# Patient Record
Sex: Female | Born: 1985 | Race: White | Hispanic: No | Marital: Married | Smoking: Former smoker
Health system: Southern US, Community
[De-identification: ages and names within clinical notes are randomized; demographics above are authoritative.]

## PROBLEM LIST (undated history)

## (undated) DIAGNOSIS — N979 Female infertility, unspecified: Secondary | ICD-10-CM

## (undated) DIAGNOSIS — E119 Type 2 diabetes mellitus without complications: Secondary | ICD-10-CM

## (undated) DIAGNOSIS — D649 Anemia, unspecified: Secondary | ICD-10-CM

## (undated) DIAGNOSIS — R002 Palpitations: Secondary | ICD-10-CM

## (undated) DIAGNOSIS — E282 Polycystic ovarian syndrome: Secondary | ICD-10-CM

## (undated) DIAGNOSIS — K429 Umbilical hernia without obstruction or gangrene: Secondary | ICD-10-CM

## (undated) DIAGNOSIS — K589 Irritable bowel syndrome without diarrhea: Secondary | ICD-10-CM

## (undated) HISTORY — DX: Umbilical hernia without obstruction or gangrene: K42.9

## (undated) HISTORY — DX: Type 2 diabetes mellitus without complications: E11.9

## (undated) HISTORY — DX: Female infertility, unspecified: N97.9

## (undated) HISTORY — DX: Irritable bowel syndrome, unspecified: K58.9

## (undated) HISTORY — PX: TONSILLECTOMY: SUR1361

## (undated) HISTORY — DX: Irritable bowel syndrome without diarrhea: K58.9

## (undated) HISTORY — DX: Anemia, unspecified: D64.9

---

## 2012-11-29 ENCOUNTER — Emergency Department (HOSPITAL_COMMUNITY)
Admission: EM | Admit: 2012-11-29 | Discharge: 2012-11-29 | Disposition: A | Discharge status: Home / Self Care | Payer: 59 | Attending: Emergency Medicine | Admitting: Emergency Medicine

## 2012-11-29 ENCOUNTER — Emergency Department (HOSPITAL_COMMUNITY): Payer: 59

## 2012-11-29 ENCOUNTER — Encounter (HOSPITAL_COMMUNITY): Payer: Self-pay | Admitting: Nurse Practitioner

## 2012-11-29 DIAGNOSIS — R630 Anorexia: Secondary | ICD-10-CM | POA: Insufficient documentation

## 2012-11-29 DIAGNOSIS — E282 Polycystic ovarian syndrome: Secondary | ICD-10-CM | POA: Insufficient documentation

## 2012-11-29 DIAGNOSIS — Z791 Long term (current) use of non-steroidal anti-inflammatories (NSAID): Secondary | ICD-10-CM | POA: Insufficient documentation

## 2012-11-29 DIAGNOSIS — R110 Nausea: Secondary | ICD-10-CM | POA: Insufficient documentation

## 2012-11-29 DIAGNOSIS — K8020 Calculus of gallbladder without cholecystitis without obstruction: Secondary | ICD-10-CM | POA: Insufficient documentation

## 2012-11-29 DIAGNOSIS — F17200 Nicotine dependence, unspecified, uncomplicated: Secondary | ICD-10-CM | POA: Insufficient documentation

## 2012-11-29 DIAGNOSIS — E119 Type 2 diabetes mellitus without complications: Secondary | ICD-10-CM | POA: Insufficient documentation

## 2012-11-29 DIAGNOSIS — Z79899 Other long term (current) drug therapy: Secondary | ICD-10-CM | POA: Insufficient documentation

## 2012-11-29 DIAGNOSIS — Z3202 Encounter for pregnancy test, result negative: Secondary | ICD-10-CM | POA: Insufficient documentation

## 2012-11-29 HISTORY — DX: Polycystic ovarian syndrome: E28.2

## 2012-11-29 LAB — COMPREHENSIVE METABOLIC PANEL
AST: 15 U/L (ref 0–37)
Albumin: 3.8 g/dL (ref 3.5–5.2)
BUN: 11 mg/dL (ref 6–23)
Calcium: 9.8 mg/dL (ref 8.4–10.5)
Creatinine, Ser: 0.82 mg/dL (ref 0.50–1.10)
Total Protein: 7.9 g/dL (ref 6.0–8.3)

## 2012-11-29 LAB — CBC WITH DIFFERENTIAL/PLATELET
Eosinophils Absolute: 0.1 10*3/uL (ref 0.0–0.7)
Eosinophils Relative: 1 % (ref 0–5)
Hemoglobin: 13.3 g/dL (ref 12.0–15.0)
Lymphocytes Relative: 35 % (ref 12–46)
Lymphs Abs: 3.4 10*3/uL (ref 0.7–4.0)
MCH: 29.4 pg (ref 26.0–34.0)
MCV: 88.3 fL (ref 78.0–100.0)
Monocytes Relative: 4 % (ref 3–12)
Platelets: 259 10*3/uL (ref 150–400)
RBC: 4.53 MIL/uL (ref 3.87–5.11)
WBC: 9.7 10*3/uL (ref 4.0–10.5)

## 2012-11-29 LAB — URINALYSIS, MICROSCOPIC ONLY
Glucose, UA: 100 mg/dL — AB
Hgb urine dipstick: NEGATIVE
Ketones, ur: NEGATIVE mg/dL
Leukocytes, UA: NEGATIVE
Protein, ur: NEGATIVE mg/dL
pH: 6 (ref 5.0–8.0)

## 2012-11-29 LAB — LIPASE, BLOOD: Lipase: 39 U/L (ref 11–59)

## 2012-11-29 MED ORDER — HYDROCODONE-ACETAMINOPHEN 5-325 MG PO TABS: 1.0000 | ORAL_TABLET | Freq: Four times a day (QID) | ORAL | Status: DC | PRN

## 2012-11-29 NOTE — ED Provider Notes (Addendum)
History     CSN: [id number]  Arrival date & time 11/29/12  1202   First MD Initiated Contact with Patient 11/29/12 1244      Chief Complaint  Patient presents with  . Abdominal Pain    (Consider location/radiation/quality/duration/timing/severity/associated sxs/prior treatment) Patient is a 27 y.o. female presenting with abdominal pain. The history is provided by the patient.  Abdominal Pain The primary symptoms of the illness include abdominal pain and nausea. The primary symptoms of the illness do not include fever, vomiting or diarrhea. The current episode started 3 to 5 hours ago. The onset of the illness was sudden. The problem has been resolved.  The abdominal pain is located in the RUQ. Pain radiation: Upper back. The severity of the abdominal pain is 10/10 (Currently pain is 0). The abdominal pain is relieved by nothing. The abdominal pain is exacerbated by eating.  The patient states that she believes she is currently not pregnant. The patient has not had a change in bowel habit. Additional symptoms associated with the illness include anorexia. Symptoms associated with the illness do not include urgency, frequency or back pain. Significant associated medical issues include diabetes.    Past Medical History  Diagnosis Date  . Polycystic ovarian disease     Past Surgical History  Procedure Date  . Tonsillectomy     No family history on file.  History  Substance Use Topics  . Smoking status: Current Every Day Smoker  . Smokeless tobacco: Not on file  . Alcohol Use: No    OB History    Grav Para Term Preterm Abortions TAB SAB Ect Mult Living                  Review of Systems  Constitutional: Negative for fever.  Gastrointestinal: Positive for nausea, abdominal pain and anorexia. Negative for vomiting and diarrhea.  Genitourinary: Negative for urgency and frequency.  Musculoskeletal: Negative for back pain.  All other systems reviewed and are  negative.    Allergies  Review of patient's allergies indicates no known allergies.  Home Medications   Current Outpatient Rx  Name  Route  Sig  Dispense  Refill  . CALCIUM CARBONATE ANTACID 500 MG PO CHEW   Oral   Chew 1 tablet by mouth as needed. For heartburn         . FLUTICASONE PROPIONATE 50 MCG/ACT NA SUSP   Nasal   Place 1 spray into the nose daily as needed. For allergies         . IBUPROFEN 200 MG PO TABS   Oral   Take 400 mg by mouth every 6 (six) hours as needed. For pain         . METFORMIN HCL 500 MG PO TABS   Oral   Take 500 mg by mouth 3 (three) times daily.         Marland Kitchen OMEPRAZOLE 40 MG PO CPDR   Oral   Take 40 mg by mouth daily.           BP 157/86  Pulse 98  Temp 97.7 F (36.5 C) (Oral)  Resp 16  SpO2 99%  LMP 11/20/2012  Physical Exam  Nursing note and vitals reviewed. Constitutional: She is oriented to person, place, and time. She appears well-developed and well-nourished. No distress.  HENT:  Head: Normocephalic and atraumatic.  Mouth/Throat: Oropharynx is clear and moist.  Eyes: Conjunctivae normal and EOM are normal. Pupils are equal, round, and reactive to light.  Neck:  Normal range of motion. Neck supple.  Cardiovascular: Normal rate, regular rhythm and intact distal pulses.   No murmur heard. Pulmonary/Chest: Effort normal and breath sounds normal. No respiratory distress. She has no wheezes. She has no rales.  Abdominal: Soft. She exhibits no distension. There is no tenderness. There is no rebound and no guarding.  Musculoskeletal: Normal range of motion. She exhibits no edema and no tenderness.  Neurological: She is alert and oriented to person, place, and time.  Skin: Skin is warm and dry. No rash noted. No erythema.  Psychiatric: She has a normal mood and affect. Her behavior is normal.    ED Course  Procedures (including critical care time)  Labs Reviewed  COMPREHENSIVE METABOLIC PANEL - Abnormal; Notable for the  following:    Sodium 134 (*)     Glucose, Bld 234 (*)     All other components within normal limits  URINALYSIS, MICROSCOPIC ONLY - Abnormal; Notable for the following:    Glucose, UA 100 (*)     All other components within normal limits  LIPASE, BLOOD  CBC WITH DIFFERENTIAL  POCT PREGNANCY, URINE   US Abdomen Complete  11/29/2012  *RADIOLOGY REPORT*  Clinical Data:  Right upper quadrant pain.  Evaluate gallbladder.  COMPLETE ABDOMINAL ULTRASOUND  Comparison:  None.  Findings:  Gallbladder:  There are echogenic stones within the gallbladder. The stones demonstrate posterior acoustic shadowing.  The patient does not have a sonographic Murphy's sign.  No evidence for gallbladder wall thickening. Largest gallstone roughly measures 1.6 cm.  Common bile duct:  Common bile duct measures up to 0.5 cm.  Liver:  No focal lesion identified.  Within normal limits in parenchymal echogenicity.  IVC:  Appears normal.  Pancreas:  Not visualized.  Spleen:  Spleen measures 7.0 cm in length without focal abnormality.  Right Kidney:  Right kidney measures 12.2 cm in length without hydronephrosis.  Left Kidney:  Left kidney measures 12.4 cm in length without hydronephrosis.  Abdominal aorta:  Poorly visualized.  IMPRESSION: Cholelithiasis.  No evidence for biliary dilatation.   Original Report Authenticated By: Richarda Overlie, M.D.      1. Cholelithiasis       MDM   Patient with symptoms most consistent with cholelithiasis. She's had 3 attacks now in the last month 2 in the last few weeks. Woke up today with severe right upper quadrant pain that radiated into her back. 5 minutes prior to being seen the pain resolved. Currently she is pain-free but states when the pain occurs she gets sweaty and nauseated. Abdomen is now benign. Will do an ultrasound to evaluate for gallstones and followup with Gen. Surgery. CBC, CMP, UA are within normal limits other than hyperglycemia and patient is a known diabetic.   3:05  PM Normal labs here. Ultrasound shows cholelithiasis and on repeat evaluation still no abdominal pain. She was discharged home with followup general surgery     Gwyneth Sprout, MD 11/29/12 1504  Gwyneth Sprout, MD 11/29/12 1506  Gwyneth Sprout, MD 11/29/12 [phone]

## 2012-11-29 NOTE — ED Notes (Signed)
Pt reports RLQ abd pain, "feels like its swollen and my back hurts" x 1 week, intermittent. Also nausea. Denies bowel/bladder changes

## 2012-11-29 NOTE — ED Notes (Signed)
Patient transported to Ultrasound 

## 2013-03-20 ENCOUNTER — Inpatient Hospital Stay (HOSPITAL_COMMUNITY)
Admission: EM | Admit: 2013-03-20 | Discharge: 2013-03-22 | DRG: 419 | Disposition: A | Discharge status: Home / Self Care | Payer: 59 | Attending: General Surgery | Admitting: General Surgery

## 2013-03-20 ENCOUNTER — Encounter (HOSPITAL_COMMUNITY): Payer: Self-pay | Admitting: Adult Health

## 2013-03-20 DIAGNOSIS — E119 Type 2 diabetes mellitus without complications: Secondary | ICD-10-CM

## 2013-03-20 DIAGNOSIS — K8010 Calculus of gallbladder with chronic cholecystitis without obstruction: Secondary | ICD-10-CM

## 2013-03-20 DIAGNOSIS — F17200 Nicotine dependence, unspecified, uncomplicated: Secondary | ICD-10-CM | POA: Diagnosis present

## 2013-03-20 DIAGNOSIS — K8000 Calculus of gallbladder with acute cholecystitis without obstruction: Principal | ICD-10-CM | POA: Diagnosis present

## 2013-03-20 DIAGNOSIS — K8020 Calculus of gallbladder without cholecystitis without obstruction: Secondary | ICD-10-CM

## 2013-03-20 DIAGNOSIS — E282 Polycystic ovarian syndrome: Secondary | ICD-10-CM | POA: Diagnosis present

## 2013-03-20 LAB — COMPREHENSIVE METABOLIC PANEL
ALT: 15 U/L (ref 0–35)
Alkaline Phosphatase: 84 U/L (ref 39–117)
BUN: 13 mg/dL (ref 6–23)
CO2: 23 mEq/L (ref 19–32)
Chloride: 101 mEq/L (ref 96–112)
GFR calc Af Amer: >90 mL/min (ref >90)
GFR calc non Af Amer: >90 mL/min (ref >90)
Glucose, Bld: 157 mg/dL — ABNORMAL HIGH (ref 70–99)
Potassium: 3.8 mEq/L (ref 3.5–5.1)
Sodium: 138 mEq/L (ref 135–145)
Total Bilirubin: 0.4 mg/dL (ref 0.3–1.2)
Total Protein: 7.9 g/dL (ref 6.0–8.3)

## 2013-03-20 LAB — CBC WITH DIFFERENTIAL/PLATELET
Hemoglobin: 14.3 g/dL (ref 12.0–15.0)
Lymphocytes Relative: 37 % (ref 12–46)
Lymphs Abs: 5.5 10*3/uL — ABNORMAL HIGH (ref 0.7–4.0)
Monocytes Relative: 6 % (ref 3–12)
Neutro Abs: 8.3 10*3/uL — ABNORMAL HIGH (ref 1.7–7.7)
Neutrophils Relative %: 56 % (ref 43–77)
Platelets: 260 10*3/uL (ref 150–400)
RBC: 4.61 MIL/uL (ref 3.87–5.11)
WBC: 14.9 10*3/uL — ABNORMAL HIGH (ref 4.0–10.5)

## 2013-03-20 MED ORDER — MORPHINE SULFATE 4 MG/ML IJ SOLN
4.0000 mg | Freq: Once | INTRAMUSCULAR | Status: AC
  Administered 2013-03-20: 4 mg via INTRAVENOUS
  Filled 2013-03-20: qty 1

## 2013-03-20 MED ORDER — ONDANSETRON HCL 4 MG/2ML IJ SOLN
4.0000 mg | Freq: Once | INTRAMUSCULAR | Status: AC
  Administered 2013-03-20: 4 mg via INTRAVENOUS
  Filled 2013-03-20: qty 2

## 2013-03-20 MED ORDER — GI COCKTAIL ~~LOC~~
30.0000 mL | Freq: Once | ORAL | Status: AC
  Administered 2013-03-20: 30 mL via ORAL
  Filled 2013-03-20: qty 30

## 2013-03-20 MED ORDER — FENTANYL CITRATE 0.05 MG/ML IJ SOLN
INTRAMUSCULAR | Status: AC
  Filled 2013-03-20: qty 2

## 2013-03-20 MED ORDER — ONDANSETRON 4 MG PO TBDP
8.0000 mg | ORAL_TABLET | Freq: Once | ORAL | Status: AC
  Administered 2013-03-20: 8 mg via ORAL

## 2013-03-20 MED ORDER — FENTANYL CITRATE 0.05 MG/ML IJ SOLN
50.0000 ug | Freq: Once | INTRAMUSCULAR | Status: AC
  Administered 2013-03-20: 50 ug via NASAL

## 2013-03-20 MED ORDER — ONDANSETRON 4 MG PO TBDP
ORAL_TABLET | ORAL | Status: AC
  Filled 2013-03-20: qty 2

## 2013-03-20 MED ORDER — SODIUM CHLORIDE 0.9 % IV BOLUS (SEPSIS)
1000.0000 mL | Freq: Once | INTRAVENOUS | Status: AC
  Administered 2013-03-20: 1000 mL via INTRAVENOUS

## 2013-03-20 NOTE — ED Notes (Signed)
Presents with recent DX of gallstones, today pain became unbearable. Reports right sided upper abdominal pain radiateion to chest and right shoulder and nausea. She was given a referral to a surgeon but is unable to afford at time. Pt is unable to sit still and rates pain 10/10

## 2013-03-20 NOTE — ED Provider Notes (Signed)
History     CSN: [id number]  Arrival date & time 03/20/13  2228   First MD Initiated Contact with Patient 03/20/13 2321      Chief Complaint  Patient presents with  . Abdominal Pain   HPI  History provided by the patient. Patient is a 27 year old female with history of polycystic ovarian disease presenting with complaints of worsening and persistent right upper quadrant abdominal pain. Patient states pains are caused from her gallstones. She was diagnosed as having gallstones in January and states that she is unable to followup with the surgeon because she cannot afford the co-pay at this time. Patient states she generally was doing well since January without any significant pains or flareups. She does report having some increased pain last Sunday but she was able to treat this at home with hot shower and Vicodin. She did much better the following day and did not have any pains until around 6 PM this evening. Pain is similar located in the right upper quadrant and rib area radiating some to the back. It was associated with nausea and one episode of vomiting. Patient attempted to take Vicodin without any improvements. She also use heating pad over the area without any changes in symptoms. He denies feeling any fever but reports occasional chills and sweats. Denies any recent urinary symptoms. No other aggravating or alleviating factors. No other associated symptoms.     Past Medical History  Diagnosis Date  . Polycystic ovarian disease     Past Surgical History  Procedure Laterality Date  . Tonsillectomy      History reviewed. No pertinent family history.  History  Substance Use Topics  . Smoking status: Current Every Day Smoker  . Smokeless tobacco: Not on file  . Alcohol Use: No    OB History   Grav Para Term Preterm Abortions TAB SAB Ect Mult Living                  Review of Systems  Constitutional: Positive for chills and diaphoresis. Negative for fever.   Respiratory: Negative for shortness of breath.   Cardiovascular: Negative for chest pain.  Gastrointestinal: Positive for nausea, vomiting and abdominal pain. Negative for diarrhea and constipation.  Genitourinary: Negative for dysuria, frequency, hematuria, flank pain, vaginal bleeding and vaginal discharge.  All other systems reviewed and are negative.    Allergies  Review of patient's allergies indicates no known allergies.  Home Medications   Current Outpatient Rx  Name  Route  Sig  Dispense  Refill  . calcium carbonate (TUMS - DOSED IN MG ELEMENTAL CALCIUM) 500 MG chewable tablet   Oral   Chew 1 tablet by mouth as needed. For heartburn         . fluticasone (FLONASE) 50 MCG/ACT nasal spray   Nasal   Place 1 spray into the nose daily as needed. For allergies         . HYDROcodone-acetaminophen (NORCO/VICODIN) 5-325 MG per tablet   Oral   Take 1 tablet by mouth every 6 (six) hours as needed for pain.   10 tablet   0   . ibuprofen (ADVIL,MOTRIN) 200 MG tablet   Oral   Take 400 mg by mouth every 6 (six) hours as needed. For pain         . metFORMIN (GLUCOPHAGE) 500 MG tablet   Oral   Take 500 mg by mouth 3 (three) times daily.         Marland Kitchen omeprazole (PRILOSEC) 40  MG capsule   Oral   Take 40 mg by mouth daily.           BP 130/66  Pulse 73  Temp(Src) 98.6 F (37 C)  Resp 16  SpO2 96%  Physical Exam  Nursing note and vitals reviewed. Constitutional: She is oriented to person, place, and time. She appears well-developed and well-nourished. No distress.  HENT:  Head: Normocephalic and atraumatic.  Cardiovascular: Normal rate and regular rhythm.   Pulmonary/Chest: Effort normal and breath sounds normal. No respiratory distress. She has no wheezes. She has no rales.  Abdominal: Soft. There is tenderness in the right upper quadrant. There is positive Murphy's sign. There is no rebound, no guarding, no CVA tenderness and no tenderness at McBurney's point.   Musculoskeletal: Normal range of motion.  Neurological: She is alert and oriented to person, place, and time.  Skin: Skin is warm and dry. No rash noted.  Psychiatric: She has a normal mood and affect. Her behavior is normal.    ED Course  Procedures   Results for orders placed during the hospital encounter of 03/20/13  CBC WITH DIFFERENTIAL      Result Value Range   WBC 14.9 (*) 4.0 - 10.5 K/uL   RBC 4.61  3.87 - 5.11 MIL/uL   Hemoglobin 14.3  12.0 - 15.0 g/dL   HCT 08.6  57.8 - 46.9 %   MCV 87.2  78.0 - 100.0 fL   MCH 31.0  26.0 - 34.0 pg   MCHC 35.6  30.0 - 36.0 g/dL   RDW 62.9  52.8 - 41.3 %   Platelets 260  150 - 400 K/uL   Neutrophils Relative 56  43 - 77 %   Neutro Abs 8.3 (*) 1.7 - 7.7 K/uL   Lymphocytes Relative 37  12 - 46 %   Lymphs Abs 5.5 (*) 0.7 - 4.0 K/uL   Monocytes Relative 6  3 - 12 %   Monocytes Absolute 0.9  0.1 - 1.0 K/uL   Eosinophils Relative 1  0 - 5 %   Eosinophils Absolute 0.1  0.0 - 0.7 K/uL   Basophils Relative 0  0 - 1 %   Basophils Absolute 0.1  0.0 - 0.1 K/uL  COMPREHENSIVE METABOLIC PANEL      Result Value Range   Sodium 138  135 - 145 mEq/L   Potassium 3.8  3.5 - 5.1 mEq/L   Chloride 101  96 - 112 mEq/L   CO2 23  19 - 32 mEq/L   Glucose, Bld 157 (*) 70 - 99 mg/dL   BUN 13  6 - 23 mg/dL   Creatinine, Ser 2.44  0.50 - 1.10 mg/dL   Calcium 01.0 (*) 8.4 - 10.5 mg/dL   Total Protein 7.9  6.0 - 8.3 g/dL   Albumin 4.0  3.5 - 5.2 g/dL   AST 15  0 - 37 U/L   ALT 15  0 - 35 U/L   Alkaline Phosphatase 84  39 - 117 U/L   Total Bilirubin 0.4  0.3 - 1.2 mg/dL   GFR calc non Af Amer >90  >90 mL/min   GFR calc Af Amer >90  >90 mL/min  LIPASE, BLOOD      Result Value Range   Lipase 37  11 - 59 U/L  POCT PREGNANCY, URINE      Result Value Range   Preg Test, Ur NEGATIVE  NEGATIVE         US Abdomen Complete  03/21/2013  *RADIOLOGY REPORT*  Clinical Data:  Abdominal pain.  COMPLETE ABDOMINAL ULTRASOUND  Comparison:  11/29/2012  Findings:   Gallbladder:  Stones are demonstrated in the dependent portion of the gallbladder and the gallbladder neck. Gallbladder neck stone does not move during the examination. Appearance is similar to previous study.  No gallbladder wall thickening or pericholecystic edema.  Murphy's sign is positive.  Changes are nonspecific but could be due to cholecystitis in the appropriate clinical setting.  Common bile duct:  Normal caliber with measured diameter of 4.3 mm.  Liver:  No focal lesion identified.  Within normal limits in parenchymal echogenicity.  IVC:  Appears normal.  Pancreas:  Pancreas is not well visualized due to overlying bowel gas.  Spleen:  Spleen length measures 12 cm.  Normal parenchymal echotexture.  Right Kidney:  Right kidney measures 12 cm length.  No hydronephrosis.  Left Kidney:  Left kidney measures 12.8 cm length.  No hydronephrosis.  Abdominal aorta:  No aneurysm identified.  IMPRESSION: Cholelithiasis and positive Murphy's sign are nonspecific but may be associated with acute cholecystitis in the appropriate clinical setting.  No bile duct dilatation.   Original Report Authenticated By: Burman Nieves, M.D.      1. Cholelithiasis       MDM  11:20PM patient seen and evaluated. Patient appears in moderate discomfort but no acute distress. History of gallstones evaluated on January 4.   Reason discussed with attending physician. Will consult Gen. surgery given location of stone and elevated WBC.   Spoke with Dr. Janee Morn which surgery. See patient and evaluate.   Dr. Janee Morn has seen patient and will admit.  Angus Seller, PA-C 03/21/13 [phone]

## 2013-03-21 ENCOUNTER — Inpatient Hospital Stay (HOSPITAL_COMMUNITY): Payer: 59

## 2013-03-21 ENCOUNTER — Observation Stay (HOSPITAL_COMMUNITY): Payer: 59 | Admitting: Anesthesiology

## 2013-03-21 ENCOUNTER — Encounter (HOSPITAL_COMMUNITY): Payer: Self-pay | Admitting: *Deleted

## 2013-03-21 ENCOUNTER — Encounter (HOSPITAL_COMMUNITY): Payer: Self-pay | Admitting: Anesthesiology

## 2013-03-21 ENCOUNTER — Emergency Department (HOSPITAL_COMMUNITY): Payer: 59

## 2013-03-21 ENCOUNTER — Encounter (HOSPITAL_COMMUNITY): Admission: EM | Disposition: A | Discharge status: Home / Self Care | Payer: Self-pay | Source: Home / Self Care

## 2013-03-21 ENCOUNTER — Ambulatory Visit: Admit: 2013-03-21 | Payer: Self-pay | Admitting: General Surgery

## 2013-03-21 DIAGNOSIS — E119 Type 2 diabetes mellitus without complications: Secondary | ICD-10-CM

## 2013-03-21 DIAGNOSIS — K8010 Calculus of gallbladder with chronic cholecystitis without obstruction: Secondary | ICD-10-CM

## 2013-03-21 DIAGNOSIS — E282 Polycystic ovarian syndrome: Secondary | ICD-10-CM

## 2013-03-21 HISTORY — PX: CHOLECYSTECTOMY: SHX55

## 2013-03-21 LAB — GLUCOSE, CAPILLARY
Glucose-Capillary: 99 mg/dL (ref 70–99)
Glucose-Capillary: 171 mg/dL — ABNORMAL HIGH (ref 70–99)

## 2013-03-21 SURGERY — LAPAROSCOPIC CHOLECYSTECTOMY WITH INTRAOPERATIVE CHOLANGIOGRAM
Anesthesia: General | Wound class: Clean Contaminated

## 2013-03-21 MED ORDER — CIPROFLOXACIN IN D5W 400 MG/200ML IV SOLN
400.0000 mg | Freq: Two times a day (BID) | INTRAVENOUS | Status: DC
  Administered 2013-03-21: 400 mg via INTRAVENOUS
  Filled 2013-03-21 (×2): qty 200

## 2013-03-21 MED ORDER — LACTATED RINGERS IV SOLN
INTRAVENOUS | Status: DC | PRN
  Administered 2013-03-21 (×2): via INTRAVENOUS

## 2013-03-21 MED ORDER — PROPOFOL 10 MG/ML IV BOLUS
INTRAVENOUS | Status: DC | PRN
  Administered 2013-03-21: 170 mg via INTRAVENOUS

## 2013-03-21 MED ORDER — GLYCOPYRROLATE 0.2 MG/ML IJ SOLN
INTRAMUSCULAR | Status: DC | PRN
  Administered 2013-03-21: 0.6 mg via INTRAVENOUS

## 2013-03-21 MED ORDER — ACETAMINOPHEN 10 MG/ML IV SOLN
INTRAVENOUS | Status: DC | PRN
  Administered 2013-03-21: 1000 mg via INTRAVENOUS

## 2013-03-21 MED ORDER — SODIUM CHLORIDE 0.9 % IR SOLN
Status: DC | PRN
  Administered 2013-03-21: 1000 mL

## 2013-03-21 MED ORDER — ONDANSETRON HCL 4 MG/2ML IJ SOLN
4.0000 mg | Freq: Four times a day (QID) | INTRAMUSCULAR | Status: DC | PRN
  Administered 2013-03-21: 4 mg via INTRAVENOUS
  Filled 2013-03-21: qty 2

## 2013-03-21 MED ORDER — FENTANYL CITRATE 0.05 MG/ML IJ SOLN
INTRAMUSCULAR | Status: DC | PRN
  Administered 2013-03-21 (×2): 50 ug via INTRAVENOUS
  Administered 2013-03-21 (×2): 100 ug via INTRAVENOUS

## 2013-03-21 MED ORDER — IOHEXOL 300 MG/ML  SOLN
INTRAMUSCULAR | Status: DC | PRN
  Administered 2013-03-21: 10 mL via INTRAVENOUS

## 2013-03-21 MED ORDER — DIPHENHYDRAMINE HCL 50 MG/ML IJ SOLN: 12.5000 mg | Freq: Four times a day (QID) | INTRAMUSCULAR | Status: DC | PRN

## 2013-03-21 MED ORDER — ONDANSETRON HCL 4 MG/2ML IJ SOLN
INTRAMUSCULAR | Status: DC | PRN
  Administered 2013-03-21: 4 mg via INTRAVENOUS

## 2013-03-21 MED ORDER — POTASSIUM CHLORIDE IN NACL 20-0.45 MEQ/L-% IV SOLN
INTRAVENOUS | Status: DC
  Filled 2013-03-21 (×2): qty 1000

## 2013-03-21 MED ORDER — ONDANSETRON HCL 4 MG/2ML IJ SOLN: 4.0000 mg | Freq: Once | INTRAMUSCULAR | Status: AC | PRN

## 2013-03-21 MED ORDER — DIPHENHYDRAMINE HCL 12.5 MG/5ML PO ELIX: 12.5000 mg | ORAL_SOLUTION | Freq: Four times a day (QID) | ORAL | Status: DC | PRN

## 2013-03-21 MED ORDER — PANTOPRAZOLE SODIUM 40 MG PO TBEC
40.0000 mg | DELAYED_RELEASE_TABLET | Freq: Every day | ORAL | Status: DC
Start: 2013-03-21 — End: 2013-03-22
  Administered 2013-03-21 – 2013-03-22 (×2): 40 mg via ORAL
  Filled 2013-03-21 (×2): qty 1

## 2013-03-21 MED ORDER — LIDOCAINE HCL (CARDIAC) 20 MG/ML IV SOLN
INTRAVENOUS | Status: DC | PRN
  Administered 2013-03-21: 75 mg via INTRAVENOUS

## 2013-03-21 MED ORDER — PANTOPRAZOLE SODIUM 40 MG IV SOLR
40.0000 mg | Freq: Every day | INTRAVENOUS | Status: DC
  Filled 2013-03-21: qty 40

## 2013-03-21 MED ORDER — MORPHINE SULFATE 4 MG/ML IJ SOLN
4.0000 mg | Freq: Once | INTRAMUSCULAR | Status: AC
  Administered 2013-03-21: 4 mg via INTRAVENOUS
  Filled 2013-03-21: qty 1

## 2013-03-21 MED ORDER — ROCURONIUM BROMIDE 100 MG/10ML IV SOLN
INTRAVENOUS | Status: DC | PRN
  Administered 2013-03-21: 50 mg via INTRAVENOUS

## 2013-03-21 MED ORDER — ACETAMINOPHEN 10 MG/ML IV SOLN: 1000.0000 mg | Freq: Once | INTRAVENOUS | Status: AC | PRN

## 2013-03-21 MED ORDER — HYDROMORPHONE HCL PF 1 MG/ML IJ SOLN
0.2500 mg | INTRAMUSCULAR | Status: DC | PRN
  Administered 2013-03-21 (×2): 0.5 mg via INTRAVENOUS

## 2013-03-21 MED ORDER — BUPIVACAINE HCL 0.25 % IJ SOLN
INTRAMUSCULAR | Status: DC | PRN
  Administered 2013-03-21: 10 mL

## 2013-03-21 MED ORDER — INSULIN ASPART 100 UNIT/ML ~~LOC~~ SOLN
0.0000 [IU] | SUBCUTANEOUS | Status: DC
  Administered 2013-03-21: 3 [IU] via SUBCUTANEOUS
  Administered 2013-03-21: 2 [IU] via SUBCUTANEOUS
  Administered 2013-03-21 (×2): 3 [IU] via SUBCUTANEOUS
  Administered 2013-03-22 (×2): 2 [IU] via SUBCUTANEOUS

## 2013-03-21 MED ORDER — HEMOSTATIC AGENTS (NO CHARGE) OPTIME
TOPICAL | Status: DC | PRN
  Administered 2013-03-21: 1 via TOPICAL

## 2013-03-21 MED ORDER — OXYCODONE-ACETAMINOPHEN 5-325 MG PO TABS
1.0000 | ORAL_TABLET | ORAL | Status: DC | PRN
  Administered 2013-03-22 (×2): 2 via ORAL
  Filled 2013-03-21 (×2): qty 2

## 2013-03-21 MED ORDER — NEOSTIGMINE METHYLSULFATE 1 MG/ML IJ SOLN
INTRAMUSCULAR | Status: DC | PRN
  Administered 2013-03-21: 3 mg via INTRAVENOUS

## 2013-03-21 MED ORDER — MORPHINE SULFATE 4 MG/ML IJ SOLN
4.0000 mg | INTRAMUSCULAR | Status: DC | PRN
  Administered 2013-03-21 (×3): 4 mg via INTRAVENOUS
  Filled 2013-03-21 (×4): qty 1

## 2013-03-21 MED ORDER — POTASSIUM CHLORIDE IN NACL 20-0.45 MEQ/L-% IV SOLN
INTRAVENOUS | Status: DC
  Administered 2013-03-21: 05:00:00 via INTRAVENOUS
  Filled 2013-03-21 (×3): qty 1000

## 2013-03-21 SURGICAL SUPPLY — 39 items
APPLIER CLIP 5 13 M/L LIGAMAX5 (MISCELLANEOUS) ×2
BENZOIN TINCTURE PRP APPL 2/3 (GAUZE/BANDAGES/DRESSINGS) ×2 IMPLANT
CANISTER SUCTION 2500CC (MISCELLANEOUS) ×2 IMPLANT
CHLORAPREP W/TINT 26ML (MISCELLANEOUS) ×2 IMPLANT
CLIP APPLIE 5 13 M/L LIGAMAX5 (MISCELLANEOUS) ×1 IMPLANT
CLOTH BEACON ORANGE TIMEOUT ST (SAFETY) ×2 IMPLANT
CLSR STERI-STRIP ANTIMIC 1/2X4 (GAUZE/BANDAGES/DRESSINGS) ×2 IMPLANT
COVER MAYO STAND STRL (DRAPES) ×2 IMPLANT
COVER SURGICAL LIGHT HANDLE (MISCELLANEOUS) ×2 IMPLANT
COVER TRANSDUCER ULTRASND (DRAPES) IMPLANT
DEVICE TROCAR PUNCTURE CLOSURE (ENDOMECHANICALS) ×2 IMPLANT
DRAPE C-ARM 42X72 X-RAY (DRAPES) ×2 IMPLANT
DRAPE UTILITY 15X26 W/TAPE STR (DRAPE) ×4 IMPLANT
ELECT REM PT RETURN 9FT ADLT (ELECTROSURGICAL) ×2
ELECTRODE REM PT RTRN 9FT ADLT (ELECTROSURGICAL) ×1 IMPLANT
GAUZE SPONGE 2X2 8PLY STRL LF (GAUZE/BANDAGES/DRESSINGS) ×1 IMPLANT
GLOVE BIO SURGEON STRL SZ7.5 (GLOVE) ×2 IMPLANT
GOWN STRL NON-REIN LRG LVL3 (GOWN DISPOSABLE) ×6 IMPLANT
GOWN STRL REIN XL XLG (GOWN DISPOSABLE) ×2 IMPLANT
IV CATH 14GX2 1/4 (CATHETERS) ×2 IMPLANT
KIT BASIN OR (CUSTOM PROCEDURE TRAY) ×2 IMPLANT
KIT ROOM TURNOVER OR (KITS) ×2 IMPLANT
NEEDLE INSUFFLATION 14GA 120MM (NEEDLE) ×2 IMPLANT
NS IRRIG 1000ML POUR BTL (IV SOLUTION) ×2 IMPLANT
PAD ARMBOARD 7.5X6 YLW CONV (MISCELLANEOUS) ×4 IMPLANT
POUCH SPECIMEN RETRIEVAL 10MM (ENDOMECHANICALS) ×2 IMPLANT
SCISSORS LAP 5X35 DISP (ENDOMECHANICALS) ×2 IMPLANT
SET CHOLANGIOGRAPHY FRANKLIN (SET/KITS/TRAYS/PACK) ×2 IMPLANT
SET IRRIG TUBING LAPAROSCOPIC (IRRIGATION / IRRIGATOR) ×2 IMPLANT
SLEEVE ENDOPATH XCEL 5M (ENDOMECHANICALS) ×2 IMPLANT
SPECIMEN JAR SMALL (MISCELLANEOUS) ×2 IMPLANT
SPONGE GAUZE 2X2 STER 10/PKG (GAUZE/BANDAGES/DRESSINGS) ×1
SUT MNCRL AB 3-0 PS2 18 (SUTURE) ×2 IMPLANT
TAPE CLOTH SOFT 2X10 (GAUZE/BANDAGES/DRESSINGS) ×2 IMPLANT
TOWEL OR 17X24 6PK STRL BLUE (TOWEL DISPOSABLE) ×2 IMPLANT
TOWEL OR 17X26 10 PK STRL BLUE (TOWEL DISPOSABLE) ×2 IMPLANT
TRAY LAPAROSCOPIC (CUSTOM PROCEDURE TRAY) ×2 IMPLANT
TROCAR XCEL NON-BLD 11X100MML (ENDOMECHANICALS) ×2 IMPLANT
TROCAR XCEL NON-BLD 5MMX100MML (ENDOMECHANICALS) ×2 IMPLANT

## 2013-03-21 NOTE — Preoperative (Signed)
Beta Blockers   Reason not to administer Beta Blockers:Not Applicable 

## 2013-03-21 NOTE — ED Provider Notes (Signed)
Medical screening examination/treatment/procedure(s) were performed by non-physician practitioner and as supervising physician I was immediately available for consultation/collaboration.   Joya Gaskins, MD 03/21/13 0700

## 2013-03-21 NOTE — Anesthesia Preprocedure Evaluation (Addendum)
Anesthesia Evaluation  Patient identified by MRN, date of birth, ID band Patient awake    Reviewed: Allergy & Precautions, H&P , NPO status , Patient's Chart, lab work & pertinent test results  Airway Mallampati: II TM Distance: >3 FB Neck ROM: Full    Dental   Pulmonary  breath sounds clear to auscultation        Cardiovascular Rhythm:Regular Rate:Normal     Neuro/Psych    GI/Hepatic   Endo/Other    Renal/GU      Musculoskeletal   Abdominal   Peds  Hematology   Anesthesia Other Findings   Reproductive/Obstetrics                           Anesthesia Physical Anesthesia Plan  ASA: II  Anesthesia Plan: General   Post-op Pain Management:    Induction: Intravenous  Airway Management Planned: Oral ETT  Additional Equipment:   Intra-op Plan:   Post-operative Plan: Extubation in OR  Informed Consent: I have reviewed the patients History and Physical, chart, labs and discussed the procedure including the risks, benefits and alternatives for the proposed anesthesia with the patient or authorized representative who has indicated his/her understanding and acceptance.   Dental advisory given  Plan Discussed with: CRNA, Anesthesiologist and Surgeon  Anesthesia Plan Comments: (Acute biliary colic with cholelithiasis Mild obesity Type 2 DM glucose 169  Plan GA with oral ETT  Kipp Brood, MD)        Anesthesia Quick Evaluation

## 2013-03-21 NOTE — Progress Notes (Signed)
Utilization Review Completed.   Khayman Kirsch, RN, BSN Nurse Case Manager  [phone]  

## 2013-03-21 NOTE — Anesthesia Postprocedure Evaluation (Signed)
  Anesthesia Post-op Note  Patient: Pamela Eaton  Procedure(s) Performed: Procedure(s): LAPAROSCOPIC CHOLECYSTECTOMY WITH INTRAOPERATIVE CHOLANGIOGRAM (N/A)  Patient Location: PACU  Anesthesia Type:General  Level of Consciousness: awake, alert  and oriented  Airway and Oxygen Therapy: Patient Spontanous Breathing  Post-op Pain: mild  Post-op Assessment: Post-op Vital signs reviewed, Patient's Cardiovascular Status Stable, Respiratory Function Stable, Patent Airway and Pain level controlled  Post-op Vital Signs: stable  Complications: No apparent anesthesia complications

## 2013-03-21 NOTE — Transfer of Care (Signed)
Immediate Anesthesia Transfer of Care Note  Patient: Pamela Eaton  Procedure(s) Performed: Procedure(s): LAPAROSCOPIC CHOLECYSTECTOMY WITH INTRAOPERATIVE CHOLANGIOGRAM (N/A)  Patient Location: PACU  Anesthesia Type:General  Level of Consciousness: awake and alert   Airway & Oxygen Therapy: Patient Spontanous Breathing and Patient connected to nasal cannula oxygen  Post-op Assessment: Report given to PACU RN and Post -op Vital signs reviewed and stable  Post vital signs: Reviewed and stable  Complications: No apparent anesthesia complications

## 2013-03-21 NOTE — Op Note (Signed)
Pre Operative Diagnosis:  Acute cholecystitis  Post Operative Diagnosis: same  Procedure: lap chole with IOC  Surgeon: Dr. Axel Filler  Assistant: none  Anesthesia: GETA  EBL: 15cc  Complications: none  Counts: reported as correct x 2  Findings:  Normal IOC.  Acutely inflammed GB  Indications for procedure:  Pt is a 27 y/o F with RUQ pain.  Pt was seen and eval'd in ED and had findings c/w acute ccy.  She was admitted and brought to the OR for lap chole with IOC.  Details of the procedure:  The patient was taken to the operating and placed in the supine position with bilateral SCDs in place. A time out was called and all facts were verified. A pneumoperitoneum was obtained via A Veress needle technique to a pressure of 14mm of mercury. A 5mm trochar was then placed in the right upper quadrant under visualization, and there were no injuries to any abdominal organs. A 11 mm port was then placed in the umbilical region after infiltrating with local anesthesia under direct visualization. A second epigastric port was placed under direct visualization. The gallbladder was identified and retracted, the peritoneum was then sharply dissected from the gallbladder and this dissection was carried down to Calot's triangle. The cystic duct was identified and stripped away circumferentially and seen going into the gallbladder 360.  The critical angle was obtained.  A Cook catheter was used to perform an intraoperative cholangiogram. The biliary radicals as well as the cystic duct and common bile duct were seen free of filling defects.  2 clips were placed distally and one proximally and the cystic duct transected. The cystic artery was identified and 2 clips placed distally and one proximally and transected.  We then proceeded to remove the gallbladder off the hepatic fossa with Bovie cautery. An Endocath bag was then placed in the abdomen and gallbladder placed in the bag. The hepatic fossa was then  reexamined and hemostasis was achieved with laparoscopic specula and Bovie cautery and was excellent at this portion of the case. The subhepatic fossa and perihepatic fossa was then irrigated until the effluent was clear. The 11 mm trocar fascia was reapproximated with the Endo Close #1 Vicryl x2.  The pneumoperitoneum was evacuated and all trochars removed under direct visulalization.  The skin was then closed with 4-0 Monocryl and the skin dressed with Steri-Strips, gauze, and tape.  The patient was awaken from general anesthesia and taken to the recovery room in stable condition.

## 2013-03-21 NOTE — ED Notes (Signed)
B. THOMPSON ( SURGEON ) AT BEDSIDE EVALUATING PT.

## 2013-03-21 NOTE — H&P (Signed)
Pamela Eaton is an 27 y.o. female.   Chief Complaint: RUQ pain HPI: Patient was initially diagnosed with cholelithiasis in January of 2014. She was given referral for our practice but has not followed up yet. She has had a couple attacks since then. She developed right upper quadrant pain yesterday and came back to the emergency department for further evaluation. White blood cell count is elevated. Liver function tests were found to be normal. She continues to have pain despite 2 rounds of narcotics. She had plans to travel to [facility] tomorrow with her job. 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  Past Medical History  Diagnosis Date  . Polycystic ovarian disease     Past Surgical History  Procedure Laterality Date  . Tonsillectomy      History reviewed. No pertinent family history. Social History:  reports that she has been smoking.  She does not have any smokeless tobacco history on file. She reports that she does not drink alcohol or use illicit drugs.  Allergies: No Known Allergies   (Not in a hospital admission)  Results for orders placed during the hospital encounter of 03/20/13 (from the past 48 hour(s))  CBC WITH DIFFERENTIAL     Status: Abnormal   Collection Time    03/20/13 10:43 PM      Result Value Range   WBC 14.9 (*) 4.0 - 10.5 K/uL   RBC 4.61  3.87 - 5.11 MIL/uL   Hemoglobin 14.3  12.0 - 15.0 g/dL   HCT 16.1  09.6 - 04.5 %   MCV 87.2  78.0 - 100.0 fL   MCH 31.0  26.0 - 34.0 pg   MCHC 35.6  30.0 - 36.0 g/dL   RDW 40.9  81.1 - 91.4 %   Platelets 260  150 - 400 K/uL   Neutrophils Relative 56  43 - 77 %   Neutro Abs 8.3 (*) 1.7 - 7.7 K/uL   Lymphocytes Relative 37  12 - 46 %   Lymphs Abs 5.5 (*) 0.7 - 4.0 K/uL   Monocytes Relative 6  3 - 12 %   Monocytes Absolute 0.9  0.1 - 1.0 K/uL   Eosinophils Relative 1  0 - 5 %   Eosinophils Absolute 0.1  0.0 - 0.7 K/uL   Basophils Relative 0  0 - 1 %   Basophils Absolute 0.1  0.0 - 0.1 K/uL  COMPREHENSIVE  METABOLIC PANEL     Status: Abnormal   Collection Time    03/20/13 10:43 PM      Result Value Range   Sodium 138  135 - 145 mEq/L   Potassium 3.8  3.5 - 5.1 mEq/L   Chloride 101  96 - 112 mEq/L   CO2 23  19 - 32 mEq/L   Glucose, Bld 157 (*) 70 - 99 mg/dL   BUN 13  6 - 23 mg/dL   Creatinine, Ser 7.82  0.50 - 1.10 mg/dL   Calcium 95.6 (*) 8.4 - 10.5 mg/dL   Total Protein 7.9  6.0 - 8.3 g/dL   Albumin 4.0  3.5 - 5.2 g/dL   AST 15  0 - 37 U/L   ALT 15  0 - 35 U/L   Alkaline Phosphatase 84  39 - 117 U/L   Total Bilirubin 0.4  0.3 - 1.2 mg/dL   GFR calc non Af Amer >90  >90 mL/min   GFR calc Af Amer >90  >90 mL/min   Comment:  The eGFR has been calculated     using the CKD EPI equation.     This calculation has not been     validated in all clinical     situations.     eGFR's persistently     <90 mL/min signify     possible Chronic Kidney Disease.  LIPASE, BLOOD     Status: None   Collection Time    03/20/13 10:43 PM      Result Value Range   Lipase 37  11 - 59 U/L  POCT PREGNANCY, URINE     Status: None   Collection Time    03/20/13 11:08 PM      Result Value Range   Preg Test, Ur NEGATIVE  NEGATIVE   Comment:            THE SENSITIVITY OF THIS     METHODOLOGY IS >24 mIU/mL   US Abdomen Complete  03/21/2013  *RADIOLOGY REPORT*  Clinical Data:  Abdominal pain.  COMPLETE ABDOMINAL ULTRASOUND  Comparison:  11/29/2012  Findings:  Gallbladder:  Stones are demonstrated in the dependent portion of the gallbladder and the gallbladder neck. Gallbladder neck stone does not move during the examination. Appearance is similar to previous study.  No gallbladder wall thickening or pericholecystic edema.  Murphy's sign is positive.  Changes are nonspecific but could be due to cholecystitis in the appropriate clinical setting.  Common bile duct:  Normal caliber with measured diameter of 4.3 mm.  Liver:  No focal lesion identified.  Within normal limits in parenchymal echogenicity.   IVC:  Appears normal.  Pancreas:  Pancreas is not well visualized due to overlying bowel gas.  Spleen:  Spleen length measures 12 cm.  Normal parenchymal echotexture.  Right Kidney:  Right kidney measures 12 cm length.  No hydronephrosis.  Left Kidney:  Left kidney measures 12.8 cm length.  No hydronephrosis.  Abdominal aorta:  No aneurysm identified.  IMPRESSION: Cholelithiasis and positive Murphy's sign are nonspecific but may be associated with acute cholecystitis in the appropriate clinical setting.  No bile duct dilatation.   Original Report Authenticated By: Burman Nieves, M.D.     Review of Systems  Constitutional: Negative.   HENT: Negative.   Eyes: Negative.   Respiratory: Negative.   Cardiovascular: Negative.   Gastrointestinal: Positive for nausea and abdominal pain. Negative for vomiting and diarrhea.  Genitourinary: Negative.   Musculoskeletal: Negative.   Skin: Negative.   Neurological: Negative.   Endo/Heme/Allergies: Negative.     Blood pressure 113/72, pulse 67, temperature 97.8 F (36.6 C), temperature source Oral, resp. rate 14, SpO2 97.00%. Physical Exam  Constitutional: She is oriented to person, place, and time. She appears well-developed and well-nourished. No distress.  HENT:  Head: Normocephalic and atraumatic.  Mouth/Throat: Oropharynx is clear and moist. No oropharyngeal exudate.  Eyes: EOM are normal. Pupils are equal, round, and reactive to light. Right eye exhibits no discharge. Left eye exhibits no discharge. No scleral icterus.  Neck: Normal range of motion. Neck supple. No tracheal deviation present.  Cardiovascular: Normal rate, regular rhythm, normal heart sounds and intact distal pulses.   No murmur heard. Respiratory: Effort normal and breath sounds normal. No stridor. No respiratory distress. She has no wheezes. She has no rales.  GI: Soft. She exhibits no distension and no mass. There is tenderness. There is no rebound and no guarding.   Tenderness in the right upper quadrant with intermittent voluntary guarding, no masses palpable, no generalized tenderness, bowel sounds  are present  Musculoskeletal: Normal range of motion.  Neurological: She is alert and oriented to person, place, and time. She exhibits normal muscle tone.  Skin: Skin is warm.  trunkal hirsuitism     Assessment/Plan Acute cholecystitis with cholelithiasis. Will admit and place on intravenous antibiotics. We'll plan cholecystectomy this admission, hopefully later today. Procedure, risks, and benefits of laparoscopic cholecystectomy with intraoperative cholangiogram were discussed in detail with the patient. I answered her questions. I will review her case with my partner, Dr. Axel Filler later this morning.  Shamela Haydon E 03/21/2013, 2:29 AM

## 2013-03-22 ENCOUNTER — Encounter (INDEPENDENT_AMBULATORY_CARE_PROVIDER_SITE_OTHER): Payer: Self-pay | Admitting: Surgery

## 2013-03-22 ENCOUNTER — Telehealth (INDEPENDENT_AMBULATORY_CARE_PROVIDER_SITE_OTHER): Payer: Self-pay | Admitting: Surgery

## 2013-03-22 MED ORDER — HYDROCODONE-ACETAMINOPHEN 5-325 MG PO TABS: 1.0000 | ORAL_TABLET | Freq: Four times a day (QID) | ORAL | Status: DC | PRN

## 2013-03-22 NOTE — Telephone Encounter (Signed)
Pt calling POD#1 lap chole w c/o "fever" 100.32F - "The sheet said that I should call"  Soreness - hydrocodone helping a little but not enough.  tol PO.  No N/V/D.  Eating OK NO BM yet.  Mild sore throat.  No sick contacts  I recommended aleve BID, ice/heat also in addition to the hydrocodone.  Palliate sore throat.  Try laxative if no BM soon.  Call if worse.  She expressed understanding and appreciation  Ardeth Sportsman, M.D., F.A.C.S. Gastrointestinal and Minimally Invasive Surgery [facility] [address] [place], [place] [address] [phone] Main / Paging

## 2013-03-22 NOTE — Discharge Summary (Signed)
Physician Discharge Summary  Patient ID:  Pamela Eaton  MRN: [id number]  DOB/AGE: 12/17/1985 27 y.o.  Admit date: 03/20/2013 Discharge date: 03/22/2013  Discharge Diagnoses:  1.    Cholecystitis with cholelithiasis 2.  PCOS (polycystic ovarian syndrome) 3.  Type 2 diabetes mellitus  Operation: Procedure(s): LAPAROSCOPIC CHOLECYSTECTOMY WITH INTRAOPERATIVE CHOLANGIOGRAM on  03/21/2013 - Dr. Derrell Lolling.  Discharged Condition: good  Hospital Course: Pamela Eaton is an 27 y.o. female whose primary care physician is No PCP Per Patient and who was admitted 03/20/2013 with a chief complaint of abdominal pain/gall bladder disease.   She was brought to the operating room on  03/21/2013 and underwent  LAPAROSCOPIC CHOLECYSTECTOMY WITH INTRAOPERATIVE CHOLANGIOGRAM.   She is now 1 day post op, is doing well, has washed her hair, and is ready to go home.  He husband is in the room. The discharge instructions were reviewed with the patient.  Consults: None  Significant Diagnostic Studies: Results for orders placed during the hospital encounter of 03/20/13  CBC WITH DIFFERENTIAL      Result Value Range   WBC 14.9 (*) 4.0 - 10.5 K/uL   RBC 4.61  3.87 - 5.11 MIL/uL   Hemoglobin 14.3  12.0 - 15.0 g/dL   HCT 30.8  65.7 - 84.6 %   MCV 87.2  78.0 - 100.0 fL   MCH 31.0  26.0 - 34.0 pg   MCHC 35.6  30.0 - 36.0 g/dL   RDW 96.2  95.2 - 84.1 %   Platelets 260  150 - 400 K/uL   Neutrophils Relative 56  43 - 77 %   Neutro Abs 8.3 (*) 1.7 - 7.7 K/uL   Lymphocytes Relative 37  12 - 46 %   Lymphs Abs 5.5 (*) 0.7 - 4.0 K/uL   Monocytes Relative 6  3 - 12 %   Monocytes Absolute 0.9  0.1 - 1.0 K/uL   Eosinophils Relative 1  0 - 5 %   Eosinophils Absolute 0.1  0.0 - 0.7 K/uL   Basophils Relative 0  0 - 1 %   Basophils Absolute 0.1  0.0 - 0.1 K/uL  COMPREHENSIVE METABOLIC PANEL      Result Value Range   Sodium 138  135 - 145 mEq/L   Potassium 3.8  3.5 - 5.1 mEq/L   Chloride 101  96 - 112 mEq/L   CO2  23  19 - 32 mEq/L   Glucose, Bld 157 (*) 70 - 99 mg/dL   BUN 13  6 - 23 mg/dL   Creatinine, Ser 3.24  0.50 - 1.10 mg/dL   Calcium 40.1 (*) 8.4 - 10.5 mg/dL   Total Protein 7.9  6.0 - 8.3 g/dL   Albumin 4.0  3.5 - 5.2 g/dL   AST 15  0 - 37 U/L   ALT 15  0 - 35 U/L   Alkaline Phosphatase 84  39 - 117 U/L   Total Bilirubin 0.4  0.3 - 1.2 mg/dL   GFR calc non Af Amer >90  >90 mL/min   GFR calc Af Amer >90  >90 mL/min  LIPASE, BLOOD      Result Value Range   Lipase 37  11 - 59 U/L  GLUCOSE, CAPILLARY      Result Value Range   Glucose-Capillary 169 (*) 70 - 99 mg/dL   Comment 1 Documented in Chart     Comment 2 Notify RN    GLUCOSE, CAPILLARY      Result Value Range  Glucose-Capillary 99  70 - 99 mg/dL   Comment 1 Notify RN    GLUCOSE, CAPILLARY      Result Value Range   Glucose-Capillary 169 (*) 70 - 99 mg/dL   Comment 1 Documented in Chart     Comment 2 Call MD NNP PA CNM    GLUCOSE, CAPILLARY      Result Value Range   Glucose-Capillary 141 (*) 70 - 99 mg/dL   Comment 1 Notify RN    GLUCOSE, CAPILLARY      Result Value Range   Glucose-Capillary 171 (*) 70 - 99 mg/dL  GLUCOSE, CAPILLARY      Result Value Range   Glucose-Capillary 148 (*) 70 - 99 mg/dL  POCT PREGNANCY, URINE      Result Value Range   Preg Test, Ur NEGATIVE  NEGATIVE    Dg Cholangiogram Operative  03/21/2013  *RADIOLOGY REPORT*  Clinical Data: Laparoscopic cholecystectomy  INTRAOPERATIVE CHOLANGIOGRAM  Technique:  Multiple fluoroscopic spot radiographs were obtained during intraoperative cholangiogram and are submitted for interpretation post-operatively.  Fluoroscopy Time: 10 seconds  Comparison: Abdominal ultrasound - 03/21/2013  Findings:  Intraoperative angiographic images of the right upper abdominal quadrant during laparoscopic cholecystectomy are provided for review.  Surgical clips overlie the expected location of the gallbladder fossa.  Contrast injection demonstrates selective cannulation of the  central aspect of the cystic duct.  There is brisk passage of contrast through the central aspect of the cystic duct with filling of a non dilated common bile duct. There is brisk passage of contrast though the CBD and into the descending portion of the duodenum.  There is minimal reflux of injected contrast into the common hepatic duct and central aspect of the nondilated intrahepatic biliary system.  There are no discrete filling defects within the opacified portions of the biliary system to suggest the presence of choledocholithiasis.  IMPRESSION:  Intraoperative cholangiogram as above.  No discrete filling defects to suggest the presence of choledocholithiasis.   Original Report Authenticated By: Tacey Ruiz, MD    US Abdomen Complete  03/21/2013  *RADIOLOGY REPORT*  Clinical Data:  Abdominal pain.  COMPLETE ABDOMINAL ULTRASOUND  Comparison:  11/29/2012  Findings:  Gallbladder:  Stones are demonstrated in the dependent portion of the gallbladder and the gallbladder neck. Gallbladder neck stone does not move during the examination. Appearance is similar to previous study.  No gallbladder wall thickening or pericholecystic edema.  Murphy's sign is positive.  Changes are nonspecific but could be due to cholecystitis in the appropriate clinical setting.  Common bile duct:  Normal caliber with measured diameter of 4.3 mm.  Liver:  No focal lesion identified.  Within normal limits in parenchymal echogenicity.  IVC:  Appears normal.  Pancreas:  Pancreas is not well visualized due to overlying bowel gas.  Spleen:  Spleen length measures 12 cm.  Normal parenchymal echotexture.  Right Kidney:  Right kidney measures 12 cm length.  No hydronephrosis.  Left Kidney:  Left kidney measures 12.8 cm length.  No hydronephrosis.  Abdominal aorta:  No aneurysm identified.  IMPRESSION: Cholelithiasis and positive Murphy's sign are nonspecific but may be associated with acute cholecystitis in the appropriate clinical setting.  No  bile duct dilatation.   Original Report Authenticated By: Burman Nieves, M.D.     Discharge Exam:  Filed Vitals:   03/22/13 0535  BP: 124/74  Pulse: 85  Temp: 98.7 F (37.1 C)  Resp: 16    General: WN obese WF who is alert and  generally healthy appearing.  Lungs: Clear to auscultation and symmetric breath sounds. Heart:  RRR. No murmur or rub. Abdomen: Soft. No mass. Incisions look good.  Discharge Medications:     Medication List    ASK your doctor about these medications       HYDROcodone-acetaminophen 5-325 MG per tablet  Commonly known as:  NORCO/VICODIN  Take 1 tablet by mouth every 6 (six) hours as needed for pain.     ibuprofen 200 MG tablet  Commonly known as:  ADVIL,MOTRIN  Take 400 mg by mouth every 6 (six) hours as needed. For pain     metFORMIN 500 MG tablet  Commonly known as:  GLUCOPHAGE  Take 500 mg by mouth 3 (three) times daily.     omeprazole 40 MG capsule  Commonly known as:  PRILOSEC  Take 40 mg by mouth daily.        Disposition: 01-Home or Self Care       Future Appointments Provider Department Dept Phone   03/30/2013 1:00 PM Melony Overly, MD [facility] [phone]     Return to work on:  03/30/2013  Activity:  Driving - May drive in 2 or 3 days, if doing well.   Lifting - No lifting > 15 pounds  Wound Care:   May shower starting tomorrow.  Diet:  Low fat  Follow up appointment:  Call Dr. Jacinto Halim office [facility]) at [phone] for an appointment in 2 weeks  Medications and dosages:  Resume your home medications.  You have a prescription for:  Vicodin  Signed: Ovidio Kin, M.D., FACS  03/22/2013, 9:02 AM

## 2013-03-23 ENCOUNTER — Encounter (HOSPITAL_COMMUNITY): Payer: Self-pay | Admitting: General Surgery

## 2013-03-30 ENCOUNTER — Encounter: Payer: Self-pay | Admitting: Obstetrics and Gynecology

## 2013-03-30 ENCOUNTER — Ambulatory Visit (INDEPENDENT_AMBULATORY_CARE_PROVIDER_SITE_OTHER): Payer: 59 | Admitting: Obstetrics and Gynecology

## 2013-03-30 VITALS — BP 118/70 | Ht 65.0 in | Wt 250.0 lb

## 2013-03-30 DIAGNOSIS — Z8742 Personal history of other diseases of the female genital tract: Secondary | ICD-10-CM

## 2013-03-30 DIAGNOSIS — Z01419 Encounter for gynecological examination (general) (routine) without abnormal findings: Secondary | ICD-10-CM

## 2013-03-30 DIAGNOSIS — N926 Irregular menstruation, unspecified: Secondary | ICD-10-CM

## 2013-03-30 DIAGNOSIS — Z0000 Encounter for general adult medical examination without abnormal findings: Secondary | ICD-10-CM

## 2013-03-30 DIAGNOSIS — F17200 Nicotine dependence, unspecified, uncomplicated: Secondary | ICD-10-CM

## 2013-03-30 DIAGNOSIS — E282 Polycystic ovarian syndrome: Secondary | ICD-10-CM

## 2013-03-30 DIAGNOSIS — Z87898 Personal history of other specified conditions: Secondary | ICD-10-CM

## 2013-03-30 LAB — POCT URINALYSIS DIPSTICK
Bilirubin, UA: NEGATIVE
Glucose, UA: NEGATIVE
Ketones, UA: NEGATIVE
Leukocytes, UA: NEGATIVE
Nitrite, UA: NEGATIVE
Protein, UA: NEGATIVE
Urobilinogen, UA: NEGATIVE
pH, UA: 5

## 2013-03-30 LAB — POCT URINE PREGNANCY: Preg Test, Ur: NEGATIVE

## 2013-03-30 MED ORDER — LEVONORGESTREL-ETHINYL ESTRAD 0.1-20 MG-MCG PO TABS: 1.0000 | ORAL_TABLET | Freq: Every day | ORAL | Status: DC

## 2013-03-30 NOTE — Patient Instructions (Signed)

## 2013-03-30 NOTE — Progress Notes (Signed)
Patient ID: Pamela Eaton, female   DOB: 1986-11-06, 27 y.o.   MRN: [id number] 27 y.o.  Married  Caucasian female   G0P0 here for annual exam.  LMP 03/15/13 Feels some lower abdominal discomfort, but only if press on the abdomen.  Some mild dypareunia, which is positional.  Having menses for the last six months.  Bleeding lasts for two weeks, ranging from bleeding to clotting.  Saw a PCP in  [place] at [place], who gave patient the Metformin Rx and was told to loose weight.  Usually gets one menses  every three months.  Used to take periodic provera and would have withdrawal bleeds.  Saw infertility specialist for a Period of time and took Clomid and another oral.  No superovulatory meds or IUI.  Considering fertility in the future.    Has taken OCPS in the past without problems.    Diagnosis is diabetes.    Wants to quit smoking and is figuring out how she is going to do it.    Had laparoscopic cholecystectomy 03/21/13.    Patient's last menstrual period was 03/15/2013.          Sexually active: yes  The current method of family planning is none.    Exercising: Elyptical Last mammogram:  never Last pap smear: 62mo- 2 yrs. Ago  History of abnormal pap: Yes 43mo-81yrs ago. Had colposcopy:wnl--no treatment. Smoking: 5 cigarettes per day Alcohol: no Last colonoscopy: never Last Bone Density: never  Last tetanus shot: 7years ago Last cholesterol check: unsure  Hgb: 13.1               Urine: Trace RBC's    Health Maintenance  Topic Date Due  . Pap Smear  05/14/2002  . Tetanus/tdap  05/15/2003  . Influenza Vaccine  07/27/2013    Family History  Problem Relation Age of Onset  . Asthma Mother   . Thyroid disease Mother   . Diabetes Father   . Migraines Sister   . Seizures Sister     Patient Active Problem List   Diagnosis Date Noted  . Cholecystitis with cholelithiasis 03/21/2013  . PCOS (polycystic ovarian syndrome) 03/21/2013  . Type 2 diabetes mellitus  03/21/2013    Past Medical History  Diagnosis Date  . Polycystic ovarian disease   . Diabetes mellitus without complication   . Infertility, female     Past Surgical History  Procedure Laterality Date  . Tonsillectomy    . Cholecystectomy N/A 03/21/2013    Procedure: LAPAROSCOPIC CHOLECYSTECTOMY WITH INTRAOPERATIVE CHOLANGIOGRAM;  Surgeon: Axel Filler, MD;  Location: [facility] OR;  Service: General;  Laterality: N/A;    Allergies: Review of patient's allergies indicates no known allergies.  Current Outpatient Prescriptions  Medication Sig Dispense Refill  . HYDROcodone-acetaminophen (NORCO/VICODIN) 5-325 MG per tablet Take 1-2 tablets by mouth every 6 (six) hours as needed for pain.  30 tablet  1  . ibuprofen (ADVIL,MOTRIN) 200 MG tablet Take 400 mg by mouth every 6 (six) hours as needed. For pain      . metFORMIN (GLUCOPHAGE) 500 MG tablet Take 500 mg by mouth 3 (three) times daily.      Marland Kitchen omeprazole (PRILOSEC) 40 MG capsule Take 40 mg by mouth daily.      Marland Kitchen HYDROcodone-acetaminophen (NORCO/VICODIN) 5-325 MG per tablet Take 1 tablet by mouth every 6 (six) hours as needed for pain.  10 tablet  0   No current facility-administered medications for this visit.    ROS: Pertinent items are noted  in HPI.  Social Hx:  Married.  Moved up from [place].  In the furniture business.  Exam:    BP 118/70  Ht 5\' 5"  (1.651 m)  Wt 250 lb (113.399 kg)  BMI 41.6 kg/m2  LMP 03/15/2013   Wt Readings from Last 3 Encounters:  03/30/13 250 lb (113.399 kg)  03/21/13 253 lb 4.8 oz (114.896 kg)  03/21/13 253 lb 4.8 oz (114.896 kg)     Ht Readings from Last 3 Encounters:  03/30/13 5\' 5"  (1.651 m)  03/21/13 5\' 5"  (1.651 m)  03/21/13 5\' 5"  (1.651 m)    General appearance: alert, cooperative and appears stated age Head: Normocephalic, without obvious abnormality, atraumatic Neck: no adenopathy, supple, symmetrical, trachea midline and thyroid not enlarged, symmetric, no  tenderness/mass/nodules Lungs: clear to auscultation bilaterally Breasts: Inspection negative, No nipple retraction or dimpling, No nipple discharge or bleeding, No axillary or supraclavicular adenopathy, Normal to palpation without dominant masses Heart: regular rate and rhythm Abdomen: soft, non-tender; bowel sounds normal; no masses,  no organomegaly.  Abdomen obese.  Scattered laparoscopic incisions without erythema.  Dermabond present. Extremities: extremities normal, atraumatic, no cyanosis or edema Skin: Skin color, texture, turgor normal. No rashes or lesions Lymph nodes: Cervical, supraclavicular, and axillary nodes normal. No abnormal inguinal nodes palpated Neurologic: Grossly normal   Pelvic: External genitalia:  no lesions              Urethra:  normal appearing urethra with no masses, tenderness or lesions              Bartholins and Skenes: normal                 Vagina: normal appearing vagina with normal color and discharge, no lesions              Cervix: normal appearance              Pap taken: yes and reflex high risk HPV studies.        Bimanual Exam:  Uterus:  uterus is normal size, shape, consistency and nontender                                      Adnexa: normal adnexa in size, nontender and no masses                                      Rectovaginal: Confirms                                      Anus:  normal sphincter tone, no lesions  UPT - negative  A: normal gyn exam History of abnormal pap. Polycystic ovarian disease.  Probable anovulatory cycles. Diabetes mellitus.  On metformin. Recent cholecystectomy. Smoker.  Desires to quit and will decide how to do this.       P:  At age 27 years old. pap smear and reflex HPV testing. Start Alesse.  See EPIC orders.  Side effects and risks discussed with patient. Return for BP and pill recheck in 6 - 8 weeks. Declines Rx for smoking cessation.  I discussed with patient changing patterns as a way to reduce  desire to smoke. return annually or prn     An  After Visit Summary was printed and given to the patient.  Marland Kitchen

## 2013-03-31 LAB — IPS PAP TEST WITH REFLEX TO HPV

## 2013-04-03 ENCOUNTER — Encounter (INDEPENDENT_AMBULATORY_CARE_PROVIDER_SITE_OTHER): Payer: Self-pay | Admitting: General Surgery

## 2013-04-03 ENCOUNTER — Ambulatory Visit (INDEPENDENT_AMBULATORY_CARE_PROVIDER_SITE_OTHER): Payer: 59 | Admitting: General Surgery

## 2013-04-03 VITALS — BP 132/84 | HR 74 | Temp 98.4°F | Resp 18 | Ht 65.0 in | Wt 249.0 lb

## 2013-04-03 DIAGNOSIS — Z9889 Other specified postprocedural states: Secondary | ICD-10-CM

## 2013-04-03 DIAGNOSIS — Z9049 Acquired absence of other specified parts of digestive tract: Secondary | ICD-10-CM

## 2013-04-03 NOTE — Progress Notes (Signed)
Patient ID: Pamela Eaton, female   DOB: 1986/06/30, 27 y.o.   MRN: [id number] 27 year old female status post laparoscopic cholecystectomy. The patient has been given well postoperatively. Her preoperative pain has resolved.  On exam: Her wound clean dry and intact  Pathology: Reveals chronic cholecystitis cholelithiasis no tumor seen. This was discussed with the patient.  Assessment and plan: 27 year old female status post laparoscopic cholecystectomy We discussed where she suffered a month. Patient return when necessary

## 2013-04-28 ENCOUNTER — Telehealth: Payer: Self-pay | Admitting: Obstetrics and Gynecology

## 2013-04-28 NOTE — Telephone Encounter (Signed)
Is very common to have irregular bleeding on new pill.  Keep going.  If she has this in second pack, will probably need to change pill.  Please have her give [place] an update over the next few weeks.

## 2013-04-28 NOTE — Telephone Encounter (Signed)
Spoke with pt about SM advice. Pt to call with irregular bleeding in second pack. Pt agreeable.

## 2013-04-28 NOTE — Telephone Encounter (Signed)
Spoke with pt who started on Lutera last month. Pt has been bleeding for about 11 days. She uses always infinity pads and changes them about every 2-3 hrs. They are not really full, but it is her preference to change them that often. Pt not sure if this is to be expected on a new pill. Pt reports the bleeding started "2 days before the last white pill."

## 2013-04-28 NOTE — Telephone Encounter (Signed)
Patient has been having heavy  bleeding for 11 days she has just started a new BC in the last month.

## 2013-05-14 ENCOUNTER — Ambulatory Visit: Payer: 59 | Admitting: Obstetrics and Gynecology

## 2013-10-01 ENCOUNTER — Other Ambulatory Visit: Payer: Self-pay

## 2013-11-01 ENCOUNTER — Other Ambulatory Visit: Payer: Self-pay | Admitting: Obstetrics and Gynecology

## 2013-11-01 ENCOUNTER — Encounter: Payer: Self-pay | Admitting: Obstetrics and Gynecology

## 2013-11-01 DIAGNOSIS — IMO0002 Reserved for concepts with insufficient information to code with codable children: Secondary | ICD-10-CM

## 2013-11-04 ENCOUNTER — Encounter: Payer: Self-pay | Admitting: Obstetrics and Gynecology

## 2013-12-16 ENCOUNTER — Encounter: Payer: Self-pay | Admitting: Obstetrics and Gynecology

## 2013-12-17 ENCOUNTER — Telehealth: Payer: Self-pay | Admitting: Obstetrics and Gynecology

## 2013-12-17 NOTE — Telephone Encounter (Signed)
Patient says she has a cyst and would like an appointment.

## 2013-12-17 NOTE — Telephone Encounter (Signed)
Spoke with patient. She states she feels she has a vaginal cyst. Says this has happened one time prior and that it resolved itself but now she is having pain with sitting. Patient is out of town in [place] for work and will not return to area until Saturday. Advised can be seen here in office on Monday and appointment scheduled but also if pain worsens, develops draining from cyst, develops fevers or worsens to go to urgent care. Patient agreeable States she will not wait for Monday if worsens.   Routing to provider for final review. Patient agreeable to disposition. Will close encounter

## 2013-12-21 ENCOUNTER — Encounter: Payer: Self-pay | Admitting: Obstetrics & Gynecology

## 2013-12-21 ENCOUNTER — Ambulatory Visit (INDEPENDENT_AMBULATORY_CARE_PROVIDER_SITE_OTHER): Payer: 59 | Admitting: Obstetrics & Gynecology

## 2013-12-21 VITALS — BP 118/80 | HR 60 | Resp 12 | Ht 65.25 in | Wt 251.8 lb

## 2013-12-21 DIAGNOSIS — N938 Other specified abnormal uterine and vaginal bleeding: Secondary | ICD-10-CM

## 2013-12-21 DIAGNOSIS — N760 Acute vaginitis: Secondary | ICD-10-CM

## 2013-12-21 DIAGNOSIS — N949 Unspecified condition associated with female genital organs and menstrual cycle: Secondary | ICD-10-CM

## 2013-12-21 DIAGNOSIS — B9689 Other specified bacterial agents as the cause of diseases classified elsewhere: Secondary | ICD-10-CM

## 2013-12-21 DIAGNOSIS — A499 Bacterial infection, unspecified: Secondary | ICD-10-CM

## 2013-12-21 LAB — POCT URINE PREGNANCY: Preg Test, Ur: NEGATIVE

## 2013-12-21 MED ORDER — FLUCONAZOLE 150 MG PO TABS: 150.0000 mg | ORAL_TABLET | Freq: Once | ORAL | Status: DC

## 2013-12-21 MED ORDER — METRONIDAZOLE 500 MG PO TABS
500.0000 mg | ORAL_TABLET | Freq: Two times a day (BID) | ORAL | Status: DC
Start: 2013-12-21 — End: 2014-08-09

## 2013-12-21 NOTE — Progress Notes (Signed)
Subjective:     Patient ID: Pamela Eaton, female   DOB: [birth year], 28 y.o.   MRN: [id number]  HPI 28 yo G P  MWF here for complaint of vaginal "cyst"/"bump".  Reports she felt this during intercourse, primarily.  This resolved on its own.  [place] [facility]  No discharge.  Mild pain/discomfort.  Felt raw at first.  If she sits down hard and in a different way, she feels this area more.  Of course, more tender if she touches it.    Review of Systems  All other systems reviewed and are negative.       Objective:   Physical Exam  Constitutional: She appears well-developed and well-nourished.  Abdominal: Soft. Bowel sounds are normal. Hernia confirmed negative in the right inguinal area and confirmed negative in the left inguinal area.  Probable small umbilical hernia.  Genitourinary:    There is rash on the right labia. There is no tenderness or lesion on the right labia. There is rash on the left labia. There is no tenderness or lesion on the left labia. Uterus is not deviated, not enlarged, not fixed and not tender. Cervix exhibits discharge. Cervix exhibits no motion tenderness and no friability. Right adnexum displays no mass, no tenderness and no fullness. Left adnexum displays no mass, no tenderness and no fullness. No erythema, tenderness or bleeding around the vagina. No foreign body around the vagina. No signs of injury around the vagina. Vaginal discharge found.  Lymphadenopathy:       Right: No inguinal adenopathy present.       Left: No inguinal adenopathy present.   Wet smear:  Ph 5.0.  Saline: no yeast.  KOH:  +Whiff    Assessment:     BV  DUB PCOS    Plan:     Flagyl 500mg  bid x 7 Diflucan 150mg  po x 1, repeat in 48hrs Recheck 2 weeks

## 2013-12-21 NOTE — Patient Instructions (Signed)

## 2014-01-12 ENCOUNTER — Encounter: Payer: Self-pay | Admitting: Obstetrics and Gynecology

## 2014-01-12 ENCOUNTER — Other Ambulatory Visit: Payer: Self-pay | Admitting: Obstetrics and Gynecology

## 2014-01-12 DIAGNOSIS — E282 Polycystic ovarian syndrome: Secondary | ICD-10-CM

## 2014-01-14 ENCOUNTER — Telehealth: Payer: Self-pay | Admitting: Obstetrics and Gynecology

## 2014-01-14 NOTE — Telephone Encounter (Signed)
Voicemail confirmed patient identity. Left message advising patient that she is scheduled with Dr. Balan 03.23.Talmage Nap2015 @0900 . Also left # [phone] if patient needs to reschedule appointment.

## 2014-03-13 IMAGING — US US ABDOMEN COMPLETE
1 series · 13 of 25 positions shown · non-contrast
Comparison: 11/29/2012

CLINICAL DATA: Abdominal pain.

COMPLETE ABDOMINAL ULTRASOUND

[Series 1: us abdomen complete · 0.30mm/px · 13 of 55 slices shown]
[im 1/55]
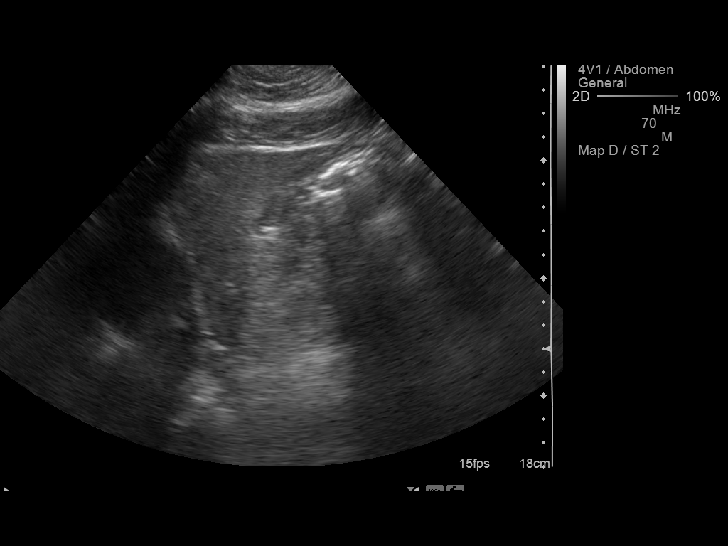
[im 5/55]
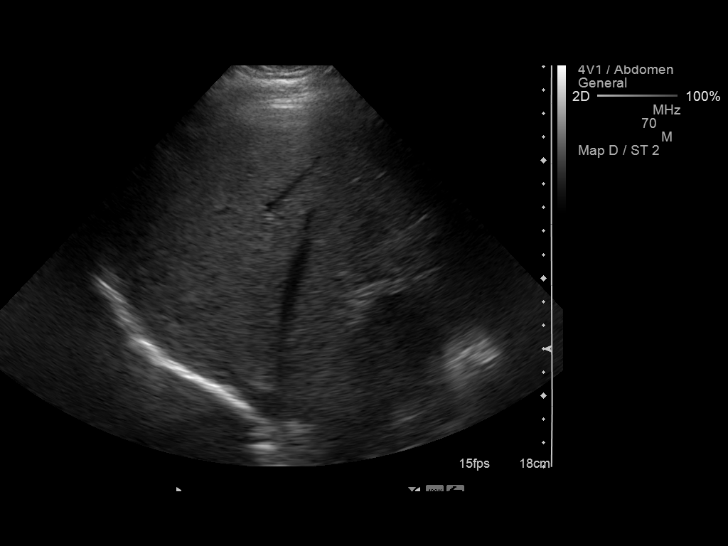
[im 10/55]
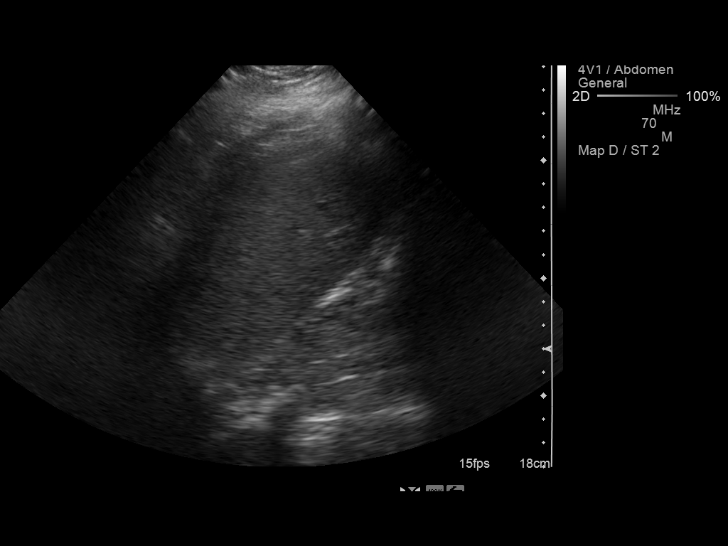
[im 14/55]
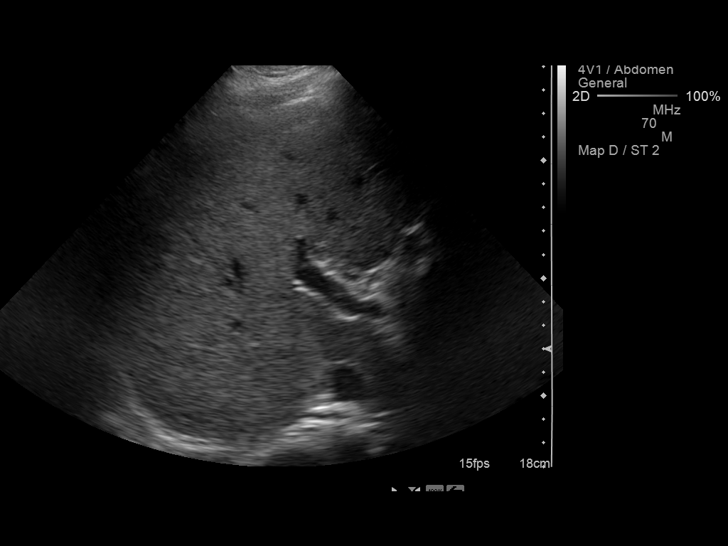
[im 19/55]
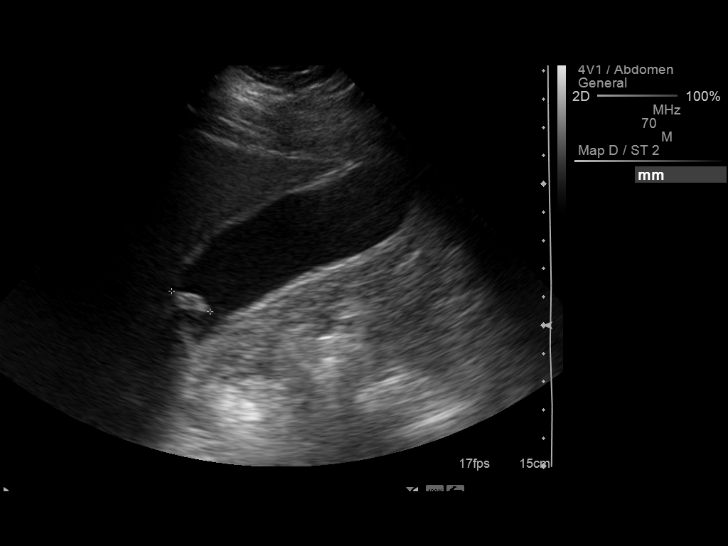
[im 23/55]
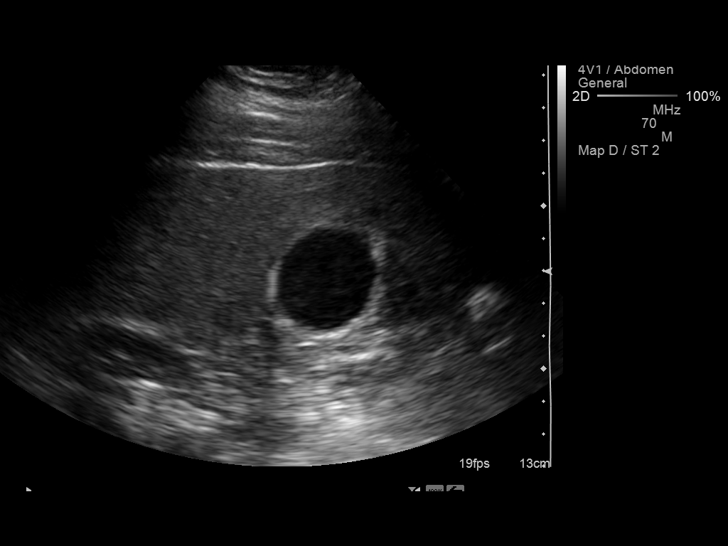
[im 28/55]
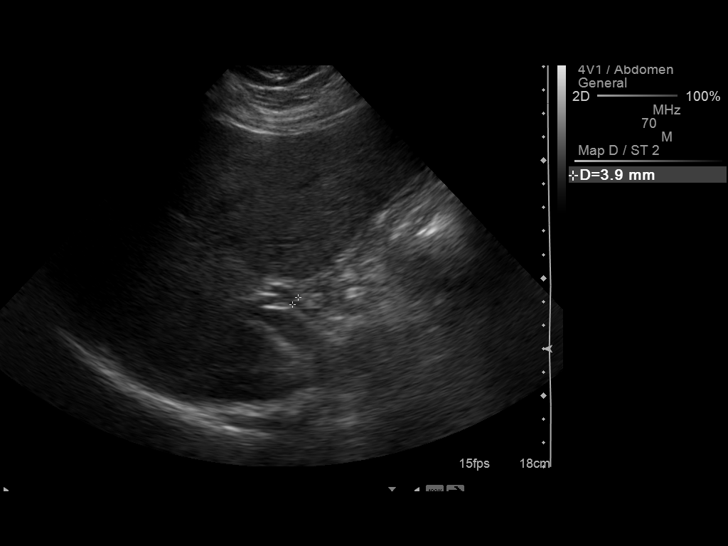
[im 32/55]
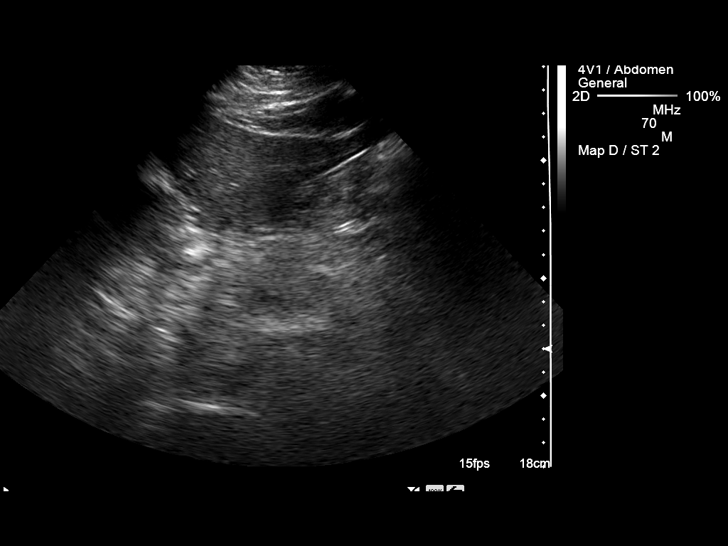
[im 37/55]
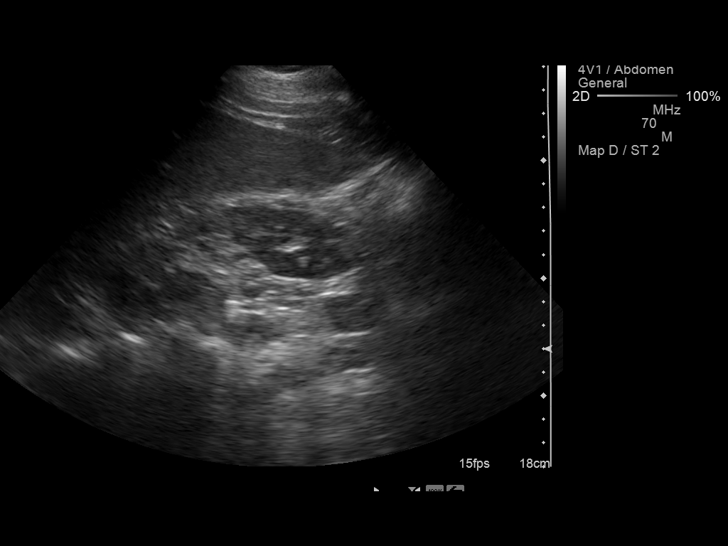
[im 41/55]
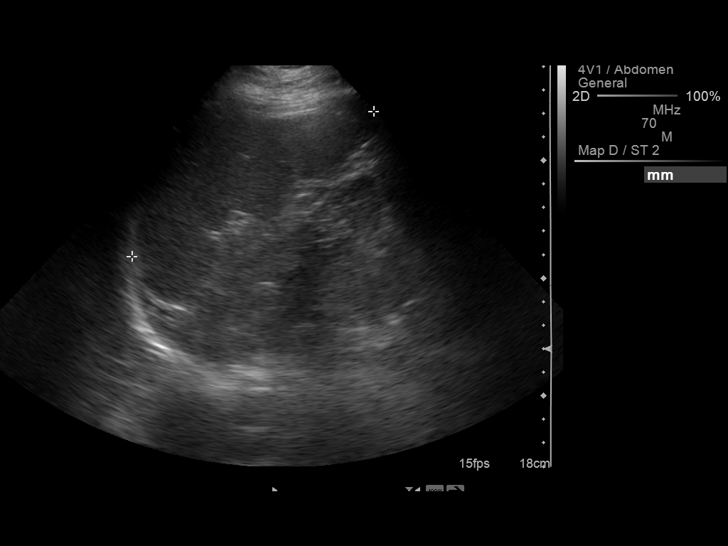
[im 46/55]
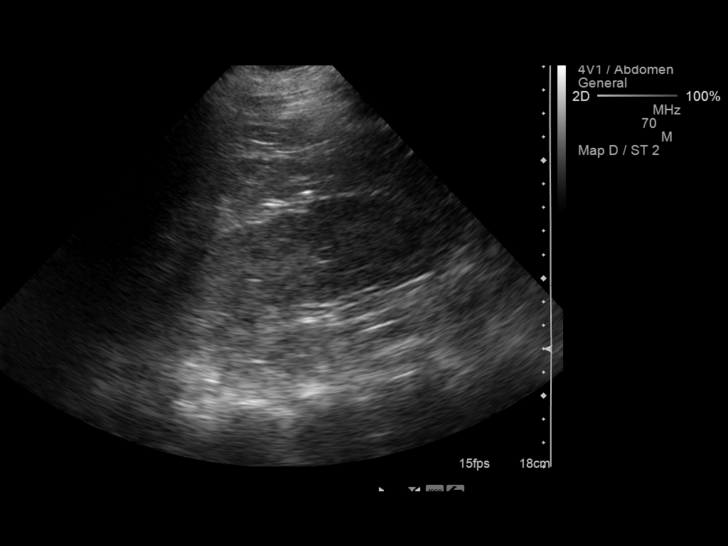
[im 50/55]
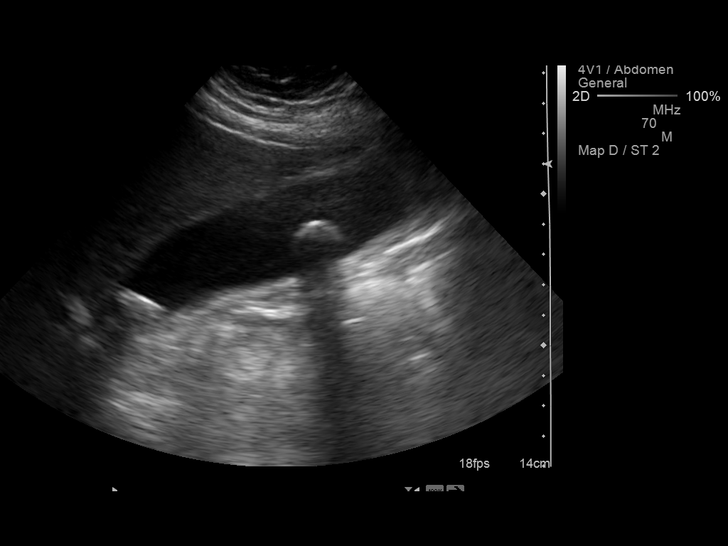
[im 55/55]
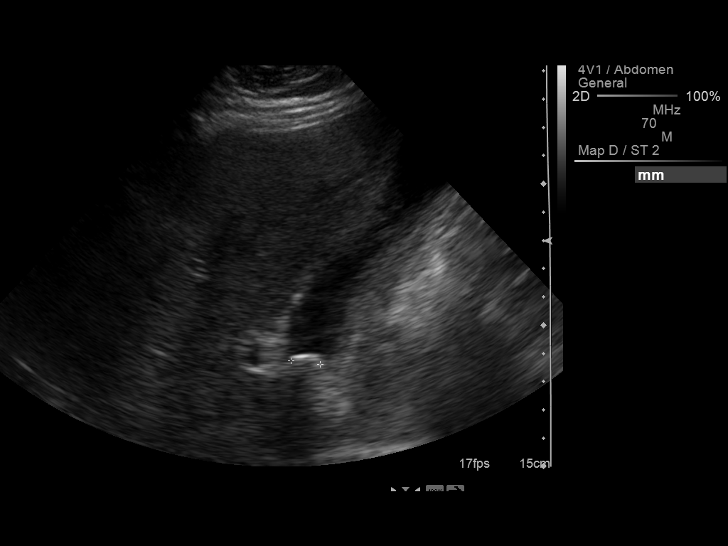

[13 of 25 positions shown; findings below may reference images not displayed]

FINDINGS: Gallbladder:  Stones are demonstrated in the dependent portion of
the gallbladder and the gallbladder neck. Gallbladder neck stone
does not move during the examination. Appearance is similar to
previous study.  No gallbladder wall thickening or pericholecystic
edema.  Murphy's sign is positive.  Changes are nonspecific but
could be due to cholecystitis in the appropriate clinical setting.

Common bile duct:  Normal caliber with measured diameter of 4.3 mm.

Liver:  No focal lesion identified.  Within normal limits in
parenchymal echogenicity.

IVC:  Appears normal.

Pancreas:  Pancreas is not well visualized due to overlying bowel
gas.

Spleen:  Spleen length measures 12 cm.  Normal parenchymal
echotexture.

Right Kidney:  Right kidney measures 12 cm length.  No
hydronephrosis.

Left Kidney:  Left kidney measures 12.8 cm length.  No
hydronephrosis.

Abdominal aorta:  No aneurysm identified.
IMPRESSION: Cholelithiasis and positive Murphy's sign are nonspecific but may
be associated with acute cholecystitis in the appropriate clinical
setting.  No bile duct dilatation.

## 2014-03-13 IMAGING — RF DG CHOLANGIOGRAM OPERATIVE
1 series · 4 of 4 positions shown · non-contrast
Comparison: Abdominal ultrasound - 03/21/2013

CLINICAL DATA: Laparoscopic cholecystectomy

INTRAOPERATIVE CHOLANGIOGRAM
TECHNIQUE: Multiple fluoroscopic spot radiographs were obtained
during intraoperative cholangiogram and are submitted for
interpretation post-operatively.
Fluoroscopy Time: 10 seconds

[Series 1: run · 4 of 58 frames shown]
[frame 9/58]
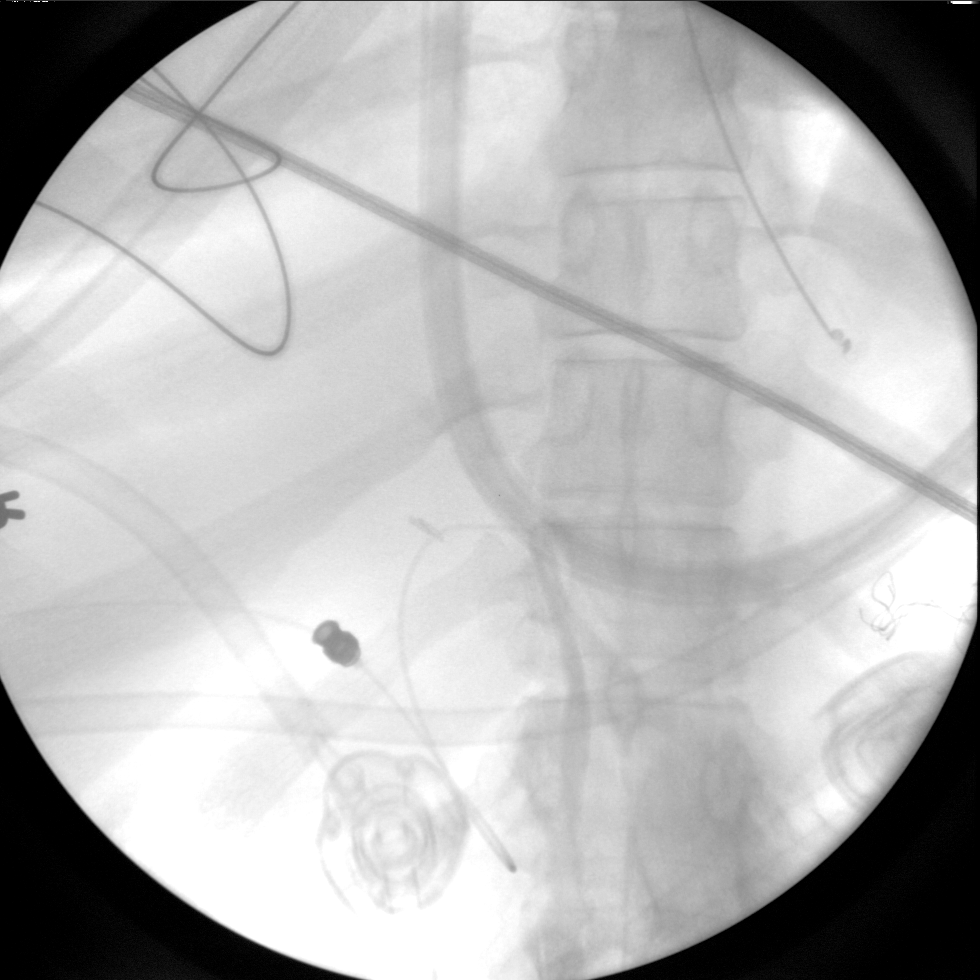
[frame 30/58]
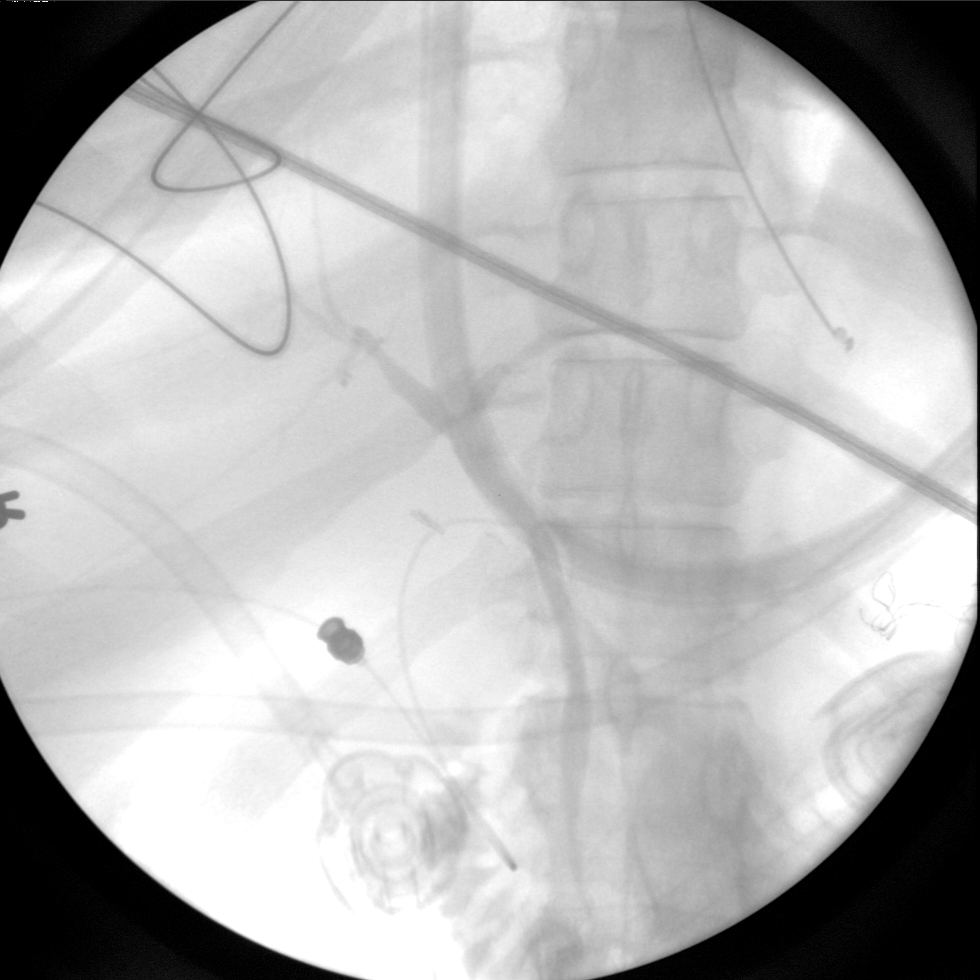
[frame 50/58]
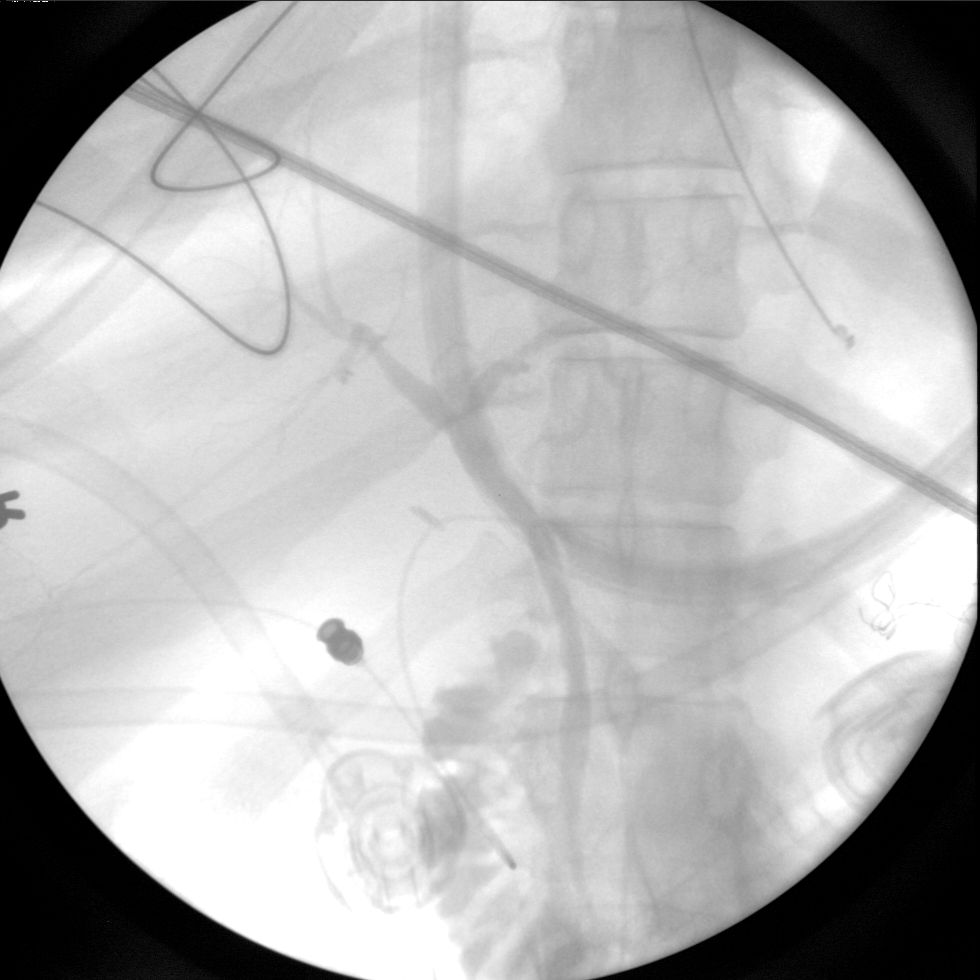
[frame 58/58]
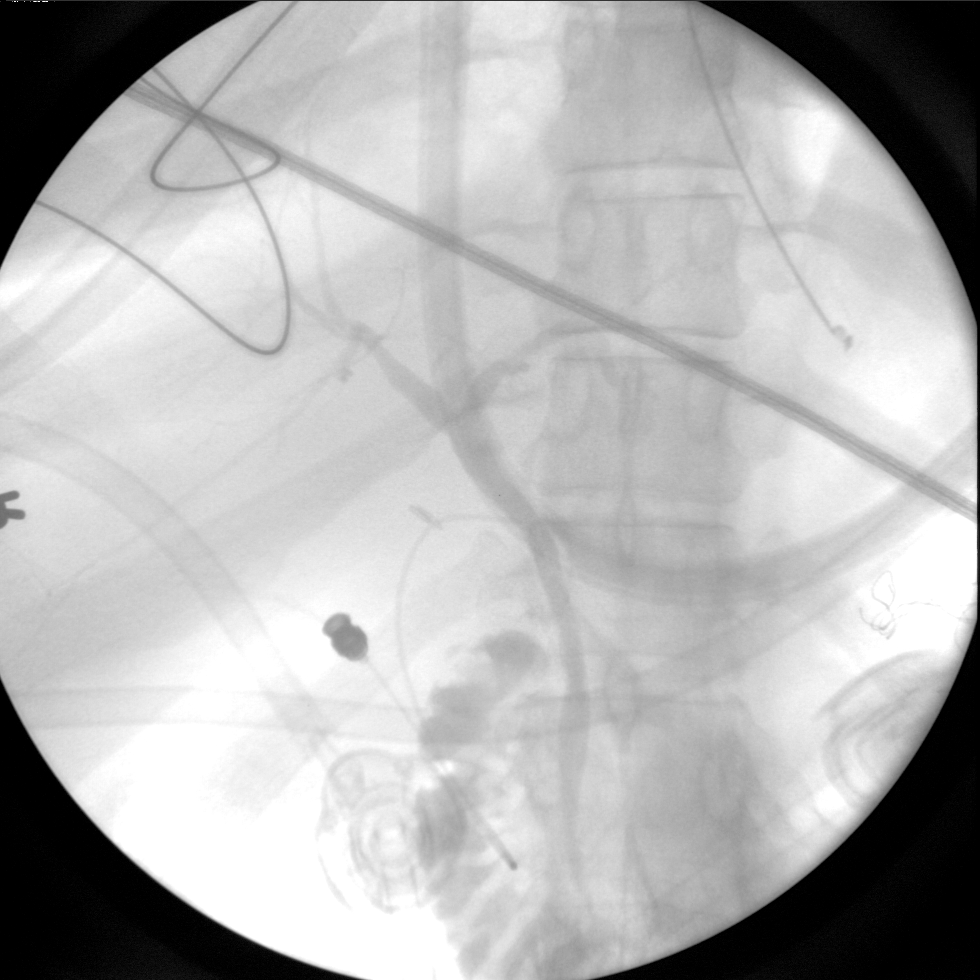

[4 of 4 positions shown; findings below may reference images not displayed]

FINDINGS: Intraoperative angiographic images of the right upper abdominal
quadrant during laparoscopic cholecystectomy are provided for
review.

Surgical clips overlie the expected location of the gallbladder
fossa.

Contrast injection demonstrates selective cannulation of the
central aspect of the cystic duct.

There is brisk passage of contrast through the central aspect of
the cystic duct with filling of a non dilated common bile duct.
There is brisk passage of contrast though the CBD and into the
descending portion of the duodenum.  There is minimal reflux of
injected contrast into the common hepatic duct and central aspect
of the nondilated intrahepatic biliary system.

There are no discrete filling defects within the opacified portions
of the biliary system to suggest the presence of
choledocholithiasis.
IMPRESSION: Intraoperative cholangiogram as above.  No discrete filling defects
to suggest the presence of choledocholithiasis.

## 2014-03-31 ENCOUNTER — Ambulatory Visit: Payer: 59 | Admitting: Obstetrics and Gynecology

## 2014-05-14 ENCOUNTER — Encounter: Payer: Self-pay | Admitting: Obstetrics and Gynecology

## 2014-05-14 ENCOUNTER — Ambulatory Visit (INDEPENDENT_AMBULATORY_CARE_PROVIDER_SITE_OTHER): Payer: 59 | Admitting: Obstetrics and Gynecology

## 2014-05-14 VITALS — BP 112/78 | HR 64 | Ht 65.5 in | Wt 255.2 lb

## 2014-05-14 DIAGNOSIS — Z01419 Encounter for gynecological examination (general) (routine) without abnormal findings: Secondary | ICD-10-CM

## 2014-05-14 DIAGNOSIS — Z0000 Encounter for general adult medical examination without abnormal findings: Secondary | ICD-10-CM

## 2014-05-14 DIAGNOSIS — N915 Oligomenorrhea, unspecified: Secondary | ICD-10-CM

## 2014-05-14 LAB — POCT URINALYSIS DIPSTICK
BILIRUBIN UA: NEGATIVE
GLUCOSE UA: NEGATIVE
KETONES UA: NEGATIVE
Leukocytes, UA: NEGATIVE
Nitrite, UA: NEGATIVE
PH UA: 5
Protein, UA: NEGATIVE
RBC UA: NEGATIVE
Urobilinogen, UA: NEGATIVE

## 2014-05-14 MED ORDER — NORETHINDRONE 0.35 MG PO TABS: 1.0000 | ORAL_TABLET | Freq: Every day | ORAL | Status: DC

## 2014-05-14 NOTE — Patient Instructions (Signed)

## 2014-05-14 NOTE — Progress Notes (Signed)
Patient ID: Pamela Eaton, female   DOB: [birth year], 28 y.o.   MRN: [id number] GYNECOLOGY VISIT  PCP:  None  Referring Jamar Weatherall:   HPI: 28 y.o.   Married  Caucasian  female   G0P0 with Patient's last menstrual period was 05/04/2014.   here for   AEX. Had three to four menses in the last year.  Had irregular cycles with Alesse. Wants a pill that does not increase risk of blood clots.    Struggling weight.  Has PCOS and adult onset diabetes mellitus. Has a new PCP.   Has an umbilical hernia. Not painful.  Hgb:   02/2014 with Dr. Talmage NapBalan Urine:  Neg  GYNECOLOGIC HISTORY: Patient's last menstrual period was 05/04/2014. Sexually active:  yes Partner preference: female Contraception:  none  Menopausal hormone therapy: n/a DES exposure:  no  Blood transfusions:  no  Sexually transmitted diseases:  no  GYN procedures and prior surgeries:  no Last mammogram:  n/a               Last pap and high risk HPV testing:  03-30-13 wnl  History of abnormal pap smear: 2012 had colposcopy but no treatment to cervix.   OB History   Grav Para Term Preterm Abortions TAB SAB Ect Mult Living   0                LIFESTYLE: Exercise:     no          Tobacco:    Smokes 1/2 ppd Alcohol:      no Drug use:  no  OTHER HEALTH MAINTENANCE: Tetanus/TDap:   2007 Gardisil:              Had severe reaction to first vaccine--fever, N & V and never completed series. Influenza:          never Zostavax:           n/a  Bone density:     n/a Colonoscopy:     n/a  Cholesterol check:   02/2014 borderline  Family History  Problem Relation Age of Onset  . Asthma Mother   . Thyroid disease Mother   . Diabetes Father   . Migraines Sister   . Seizures Sister   . Rheum arthritis Sister     Patient Active Problem List   Diagnosis Date Noted  . Cholecystitis with cholelithiasis 03/21/2013  . PCOS (polycystic ovarian syndrome) 03/21/2013  . Type 2 diabetes mellitus 03/21/2013   Past Medical History   Diagnosis Date  . Polycystic ovarian disease   . Diabetes mellitus without complication   . Infertility, female   . Umbilical hernia     Past Surgical History  Procedure Laterality Date  . Tonsillectomy    . Cholecystectomy N/A 03/21/2013    Procedure: LAPAROSCOPIC CHOLECYSTECTOMY WITH INTRAOPERATIVE CHOLANGIOGRAM;  Surgeon: Axel FillerArmando Ramirez, MD;  Location: [facility] OR;  Service: General;  Laterality: N/A;    ALLERGIES: Glipizide  Current Outpatient Prescriptions  Medication Sig Dispense Refill  . Fish Oil-Cholecalciferol (FISH OIL + D3) 1000-1000 MG-UNIT CAPS Take 1,000 mg by mouth 3 (three) times daily.      Marland Kitchen. ibuprofen (ADVIL,MOTRIN) 200 MG tablet Take 400 mg by mouth every 6 (six) hours as needed. For pain      . LEVEMIR FLEXTOUCH 100 UNIT/ML Pen Inject 100 Units into the skin at bedtime.      . metFORMIN (GLUCOPHAGE-XR) 500 MG 24 hr tablet Take 500 mg by mouth 2 (two) times daily.      .Marland Kitchen  omeprazole (PRILOSEC) 40 MG capsule Take 40 mg by mouth daily.      . metroNIDAZOLE (FLAGYL) 500 MG tablet Take 1 tablet (500 mg total) by mouth 2 (two) times daily.  14 tablet  0   No current facility-administered medications for this visit.     ROS:  Pertinent items are noted in HPI.  SOCIAL HISTORY:  Married.  Manager.   PHYSICAL EXAMINATION:    BP 112/78  Pulse 64  Ht 5' 5.5" (1.664 m)  Wt 255 lb 3.2 oz (115.758 kg)  BMI 41.81 kg/m2  LMP 05/04/2014   Wt Readings from Last 3 Encounters:  05/14/14 255 lb 3.2 oz (115.758 kg)  12/21/13 251 lb 12.8 oz (114.216 kg)  04/03/13 249 lb (112.946 kg)     Ht Readings from Last 3 Encounters:  05/14/14 5' 5.5" (1.664 m)  12/21/13 5' 5.25" (1.657 m)  04/03/13 5\' 5"  (1.651 m)    General appearance: alert, cooperative and appears stated age Head: Normocephalic, without obvious abnormality, atraumatic Neck: no adenopathy, supple, symmetrical, trachea midline and thyroid not enlarged, symmetric, no tenderness/mass/nodules Lungs: clear to  auscultation bilaterally Breasts: Inspection negative, No nipple retraction or dimpling, No nipple discharge or bleeding, No axillary or supraclavicular adenopathy, Normal to palpation without dominant masses Heart: regular rate and rhythm Abdomen: small umbilical hernia, soft, non-tender; no masses,  no organomegaly Extremities: extremities normal, atraumatic, no cyanosis or edema Skin: Skin color, texture, turgor normal. No rashes or lesions Lymph nodes: Cervical, supraclavicular, and axillary nodes normal. No abnormal inguinal nodes palpated Neurologic: Grossly normal  Pelvic: External genitalia:  no lesions              Urethra:  normal appearing urethra with no masses, tenderness or lesions              Bartholins and Skenes: normal                 Vagina: normal appearing vagina with normal color and discharge, no lesions              Cervix: normal appearance              Pap and high risk HPV testing done: yes.            Bimanual Exam:  Uterus:  uterus is normal size, shape, consistency and nontender                                      Adnexa: normal adnexa in size, nontender and no masses                                      Rectovaginal: Confirms                                      Anus:  normal sphincter tone, no lesions  ASSESSMENT  Normal gynecologic exam. History of abnormal pap.  Oligomenorrhea.  Polycystic ovarian disease. Adult onset diabetes.  Obesity.   PLAN  Pap smear and high risk HPV testing performed.  Counseled on self breast exam, exercise, weight loss. Prolactin, TSH, LH, FSH, quant BHCG. Will abstain from intercourse until have results back from quant BHCG. Can then start Camilla.  Instructed in  use.  Return annually or prn   An After Visit Summary was printed and given to the patient.

## 2014-05-15 LAB — FSH/LH
FSH: 6.3 m[IU]/mL
LH: 5.3 m[IU]/mL

## 2014-05-15 LAB — PROLACTIN: PROLACTIN: 6.5 ng/mL

## 2014-05-15 LAB — TSH: TSH: 2.412 u[IU]/mL (ref 0.350–4.500)

## 2014-05-15 LAB — HCG, QUANTITATIVE, PREGNANCY

## 2014-05-18 LAB — IPS PAP TEST WITH HPV

## 2014-07-23 ENCOUNTER — Encounter: Payer: Self-pay | Admitting: Obstetrics and Gynecology

## 2014-07-25 ENCOUNTER — Encounter: Payer: Self-pay | Admitting: Obstetrics and Gynecology

## 2014-08-09 ENCOUNTER — Ambulatory Visit (INDEPENDENT_AMBULATORY_CARE_PROVIDER_SITE_OTHER): Payer: 59 | Admitting: Obstetrics and Gynecology

## 2014-08-09 ENCOUNTER — Encounter: Payer: Self-pay | Admitting: Obstetrics and Gynecology

## 2014-08-09 VITALS — BP 120/80 | HR 66 | Ht 65.5 in | Wt 254.8 lb

## 2014-08-09 DIAGNOSIS — N912 Amenorrhea, unspecified: Secondary | ICD-10-CM

## 2014-08-09 DIAGNOSIS — N750 Cyst of Bartholin's gland: Secondary | ICD-10-CM

## 2014-08-09 LAB — POCT URINE PREGNANCY: PREG TEST UR: NEGATIVE

## 2014-08-09 MED ORDER — NORETHINDRONE 0.35 MG PO TABS: 1.0000 | ORAL_TABLET | Freq: Every day | ORAL | Status: DC

## 2014-08-09 MED ORDER — MEDROXYPROGESTERONE ACETATE 10 MG PO TABS: 10.0000 mg | ORAL_TABLET | Freq: Every day | ORAL | Status: DC

## 2014-08-09 NOTE — Patient Instructions (Signed)
Bartholin's Cyst or Abscess °Bartholin's glands are small glands located within the folds of skin (labia) along the sides of the lower opening of the vagina (birth canal). A cyst may develop when the duct of the gland becomes blocked. When this happens, fluid that accumulates within the cyst can become infected. This is known as an abscess. The Bartholin gland produces a mucous fluid to lubricate the outside of the vagina during sexual intercourse. °SYMPTOMS  °· Patients with a small cyst may not have any symptoms. °· Mild discomfort to severe pain depending on the size of the cyst and if it is infected (abscess). °· Pain, redness, and swelling around the lower opening of the vagina. °· Painful intercourse. °· Pressure in the perineal area. °· Swelling of the lips of the vagina (labia). °· The cyst or abscess can be on one side or both sides of the vagina. °DIAGNOSIS  °· A large swelling is seen in the lower vagina area by your caregiver. °· Painful to touch. °· Redness and pain, if it is an abscess. °TREATMENT  °· Sometimes the cyst will go away on its own. °· Apply warm wet compresses to the area or take hot sitz baths several times a day. °· An incision to drain the cyst or abscess with local anesthesia. °· Culture the pus, if it is an abscess. °· Antibiotic treatment, if it is an abscess. °· Cut open the gland and suture the edges to make the opening of the gland bigger (marsupialization). °· Remove the whole gland if the cyst or abscess returns. °PREVENTION  °· Practice good hygiene. °· Clean the vaginal area with a mild soap and soft cloth when bathing. °· Do not rub hard in the vaginal area when bathing. °· Protect the crotch area with a padded cushion if you take long bike rides or ride horses. °· Be sure you are well lubricated when you have sexual intercourse. °HOME CARE INSTRUCTIONS  °· If your cyst or abscess was opened, a small piece of gauze, or a drain, may have been placed in the wound to allow  drainage. Do not remove this gauze or drain unless directed by your caregiver. °· Wear feminine pads, not tampons, as needed for any drainage or bleeding. °· If antibiotics were prescribed, take them exactly as directed. Finish the entire course. °· Only take over-the-counter or prescription medicines for pain, discomfort, or fever as directed by your caregiver. °SEEK IMMEDIATE MEDICAL CARE IF:  °· You have an increase in pain, redness, swelling, or drainage. °· You have bleeding from the wound which results in the use of more than the number of pads suggested by your caregiver in 24 hours. °· You have chills. °· You have a fever. °· You develop any new problems (symptoms) or aggravation of your existing condition. °MAKE SURE YOU:  °· Understand these instructions. °· Will watch your condition. °· Will get help right away if you are not doing well or get worse. °Document Released: 11/12/2005 Document Revised: 02/04/2012 Document Reviewed: 06/30/2008 °ExitCare® Patient Information ©2015 [employer]. This information is not intended to replace advice given to you by your health care provider. Make sure you discuss any questions you have with your health care provider. ° °

## 2014-08-09 NOTE — Progress Notes (Signed)
Patient ID: Pamela Eaton, female   DOB: [birth year], 28 y.o.   MRN: [id number] GYNECOLOGY VISIT  PCP:   None  Referring provider:   HPI: 28 y.o.   Married  Caucasian  female   G0P0 with Patient's last menstrual period was 05/04/2014.   here for  Evaluation of left vulvar lesion x2 weeks.  Patient states not painful.  No known drainage but not really clear.   Patient never began [place] OCP's due to an Insurance problem.  Patient thinks it has been corrected and will pick up Rx soon so she can begin.  Menses a few times a year.  Had hormonal labs which were normal.   Has periodic right sided pain off and on for one year.   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   Urine UPT: Neg GYNECOLOGIC HISTORY: Patient's last menstrual period was 05/04/2014. Sexually active:  yes Partner preference: female Contraception:  None Menopausal hormone therapy: n/a DES exposure:  no  Blood transfusions:   no Sexually transmitted diseases:   no GYN procedures and prior surgeries:  no Last mammogram: n/a                Last pap and high risk HPV testing:   05-14-14 wnl:neg HR HPV History of abnormal pap smear: 2012 colposcopy but not treatment to cervix.    OB History   Grav Para Term Preterm Abortions TAB SAB Ect Mult Living   0                LIFESTYLE: Exercise:   no           Tobacco:  5 cigarettes a day Alcohol:   no Drug use:  no  Patient Active Problem List   Diagnosis Date Noted  . Cholecystitis with cholelithiasis 03/21/2013  . PCOS (polycystic ovarian syndrome) 03/21/2013  . Type 2 diabetes mellitus 03/21/2013    Past Medical History  Diagnosis Date  . Polycystic ovarian disease   . Diabetes mellitus without complication   . Infertility, female   . Umbilical hernia     Past Surgical History  Procedure Laterality Date  . Tonsillectomy    . Cholecystectomy N/A 03/21/2013    Procedure: LAPAROSCOPIC CHOLECYSTECTOMY WITH INTRAOPERATIVE CHOLANGIOGRAM;  Surgeon:  Axel Filler, MD;  Location: [facility] OR;  Service: General;  Laterality: N/A;    Current Outpatient Prescriptions  Medication Sig Dispense Refill  . Fish Oil-Cholecalciferol (FISH OIL + D3) 1000-1000 MG-UNIT CAPS Take 1,000 mg by mouth 3 (three) times daily.      Marland Kitchen ibuprofen (ADVIL,MOTRIN) 200 MG tablet Take 400 mg by mouth every 6 (six) hours as needed. For pain      . metFORMIN (GLUCOPHAGE-XR) 500 MG 24 hr tablet Take 500 mg by mouth at bedtime.       . metoprolol succinate (TOPROL-XL) 12.5 mg TB24 24 hr tablet Take 12.5 mg by mouth daily. For heart palpitations      . omeprazole (PRILOSEC) 40 MG capsule Take 40 mg by mouth daily.       No current facility-administered medications for this visit.     ALLERGIES: Glipizide  Family History  Problem Relation Age of Onset  . Asthma Mother   . Thyroid disease Mother   . Diabetes Father   . Migraines Sister   . Seizures Sister   . Rheum arthritis Sister     History   Social History  . Marital Status: Married    Spouse Name: N/A  Number of Children: N/A  . Years of Education: N/A   Occupational History  . Not on file.   Social History Main Topics  . Smoking status: Current Every Day Smoker -- 0.50 packs/day    Types: Cigarettes  . Smokeless tobacco: Not on file     Comment: 5 cigarettes per day  . Alcohol Use: No  . Drug Use: No  . Sexual Activity: Yes    Partners: Male    Birth Control/ Protection: None   Other Topics Concern  . Not on file   Social History Narrative  . No narrative on file    ROS:  Pertinent items are noted in HPI.  PHYSICAL EXAMINATION:    BP 120/80  Pulse 66  Ht 5' 5.5" (1.664 m)  Wt 254 lb 12.8 oz (115.577 kg)  BMI 41.74 kg/m2  LMP 05/04/2014   Wt Readings from Last 3 Encounters:  08/09/14 254 lb 12.8 oz (115.577 kg)  05/14/14 255 lb 3.2 oz (115.758 kg)  12/21/13 251 lb 12.8 oz (114.216 kg)     Ht Readings from Last 3 Encounters:  08/09/14 5' 5.5" (1.664 m)  05/14/14 5' 5.5"  (1.664 m)  12/21/13 5' 5.25" (1.657 m)    General appearance: alert, cooperative and appears stated age   Pelvic: External genitalia:  no lesions              Urethra:  normal appearing urethra with no masses, tenderness or lesions              Bartholins and Skenes: normal                 Vagina: normal appearing vagina with normal color and discharge, left Bartholin's cyst 2 cm, nontender, nondraining.               Cervix: normal appearance                 Bimanual Exam:  Uterus:  uterus is normal size, shape, consistency and nontender                                      Adnexa: normal adnexa in size, nontender and no masses                                      Rectovaginal exam confirms the Bartholin's cyst.  ASSESSMENT  Oligomenorrhea.   Likely PCOS. Left Bartholin's gland cyst Right lower quadrant pain.   PLAN  Provera 10 mg po q d for 10 days.  Then [employer].  Instructed in use and back up protection.  Discussion regarding Bartholin's gland cyst and potential abscess formation.  Ultrasound if pain persists in the right lower quadrant.   An After Visit Summary was printed and given to the patient.  25 minutes face to face time of which over 50% was spent in counseling.

## 2014-08-09 NOTE — Telephone Encounter (Signed)
Call to patient. Appointment scheduled for 3 pm today with Dr Edward Jolly.  Routing to provider for final review. Patient agreeable to disposition. Will close encounter

## 2014-08-13 ENCOUNTER — Encounter: Payer: Self-pay | Admitting: Obstetrics and Gynecology

## 2014-08-17 NOTE — Telephone Encounter (Signed)
Message copied by Jannet Askew on Tue Aug 17, 2014  1:52 PM ------      Message from: Ricki Miller DE Gwenevere Ghazi, BROOK E      Created: Tue Aug 17, 2014 12:44 PM      Regarding: Please contact patient to see if she wants an ultrasound done       Please contact the patient for me to see if she would like to proceed with a pelvic ultrasound for RLQ pain.            I saw her for a Bartholin's gland cyst and oligomenorrhea and started her on Micronor following a course of Provera.             She mentioned off and on right sided pain at the visit, but no ultrasound was ordered at that time.             I later sent her a message through My Chart to ask her if she wants to proceed with a pelvic ultrasound of wait to see the effect of her birth control pills on her pain.   She has not responded to date.            Thanks for checking with her.             Conley Simmonds ------

## 2014-08-17 NOTE — Telephone Encounter (Signed)
Spoke with patient. Patient states that she would like to try the birth control pills for one month to see if this takes care of her pain. If not patient will call back to schedule ultrasound. Advised if at anytime pain changes or worsens will need to call to schedule appointment. Patient is agreeable.  Routing to provider for final review. Patient agreeable to disposition. Will close encounter

## 2014-12-29 ENCOUNTER — Encounter: Payer: Self-pay | Admitting: Obstetrics and Gynecology

## 2014-12-29 DIAGNOSIS — R1031 Right lower quadrant pain: Secondary | ICD-10-CM

## 2014-12-30 NOTE — Telephone Encounter (Signed)
Left message to call Kaitlyn at [phone]. 

## 2015-01-04 ENCOUNTER — Telehealth: Payer: Self-pay | Admitting: Obstetrics and Gynecology

## 2015-01-04 NOTE — Telephone Encounter (Signed)
Spoke with patient in regards to Madronemychart message sent on 12/29/2014. Patient is agreeable to schedule PUS evaluation with Dr.Silva. Appointment scheduled for 2/25 at 8am with 8:30am consult with Dr.Silva. Agreeable to date and time. Patient verbalized understanding of the [facility] appointment cancellation policy. Advised will need to cancel or reschedule within 72 business hours of appointment (3 business days) or will have $100.00 late cancellation fee placed to account. $150.00 for Sonohysterogram. Order will need precert.  Cc: Cathrine MusterSabrina Franklin for precert  Routing to provider for final review. Patient agreeable to disposition. Will close encounter

## 2015-01-04 NOTE — Telephone Encounter (Signed)
Patient is returning a call to Kaitlyn. °

## 2015-01-04 NOTE — Telephone Encounter (Signed)
Please see mychart encounter from 12/29/2014.  Routing to provider for final review. Patient agreeable to disposition. Will close encounter

## 2015-01-04 NOTE — Telephone Encounter (Signed)
Left message to call Kaitlyn at [phone]. 

## 2015-01-20 ENCOUNTER — Encounter: Payer: Self-pay | Admitting: Obstetrics and Gynecology

## 2015-01-20 ENCOUNTER — Ambulatory Visit (INDEPENDENT_AMBULATORY_CARE_PROVIDER_SITE_OTHER): Payer: 59

## 2015-01-20 ENCOUNTER — Ambulatory Visit (INDEPENDENT_AMBULATORY_CARE_PROVIDER_SITE_OTHER): Payer: 59 | Admitting: Obstetrics and Gynecology

## 2015-01-20 VITALS — BP 118/82 | HR 88 | Ht 65.5 in | Wt 238.0 lb

## 2015-01-20 DIAGNOSIS — E282 Polycystic ovarian syndrome: Secondary | ICD-10-CM

## 2015-01-20 DIAGNOSIS — R1031 Right lower quadrant pain: Secondary | ICD-10-CM

## 2015-01-20 NOTE — Progress Notes (Signed)
Subjective   Patient is here today for pelvic ultrasound for RLQ pain.  Had pain several months ago and did not proceed with ultrasound at that time.  Pain is not there all the time.  Pain can occur with intercourse.  May be occurring two week prior to last cycle.  LMP  History of amenorrhea with normal TSH, prolactin, LH, and FSH. Never took camilla. Having cycles every 8 weeks and last a full week.  Lost 10 pounds and cycles are more normal.  Objective  Pelvic ultrasound images and report reviewed with patient.   Uterus with no masses.  EMS 4.17 mm.  Ovaries with multiple follicles and left ovary with 8 x 5 mm echogenic focus.   Possible polycytic ovaries.  No free fluid.       Assessment  Polycystic ovaries. RLQ pain.  Possible ovulatory pain.  Echogenic focus of left ovary.  Probable fibroadenoma  Plan  Discussion of PCOS. Call if not having menses at least every 2 months.  Explained rationale for this to avoid hyperplasia and risk of abnormal bleeding and endometrial cancer.  Monitor pain.  Call if worsens.  Repeat ultrasound in 6 months.  Annual exam in June 2016.  15 minutes face to face time of which over 50% was spent in counseling.   After visit summary to patient.

## 2015-01-21 ENCOUNTER — Telehealth: Payer: Self-pay | Admitting: Obstetrics and Gynecology

## 2015-01-21 NOTE — Telephone Encounter (Signed)
Left message for patient to call back. Need to go over benefits and schedule follow up PUS

## 2015-01-28 NOTE — Telephone Encounter (Signed)
Left message for patient to call back  

## 2015-04-23 ENCOUNTER — Encounter: Payer: Self-pay | Admitting: Emergency Medicine

## 2015-04-23 ENCOUNTER — Emergency Department
Admission: EM | Admit: 2015-04-23 | Discharge: 2015-04-23 | Disposition: A | Discharge status: Home / Self Care | Payer: 59 | Source: Home / Self Care | Attending: Emergency Medicine | Admitting: Emergency Medicine

## 2015-04-23 ENCOUNTER — Emergency Department (INDEPENDENT_AMBULATORY_CARE_PROVIDER_SITE_OTHER): Payer: 59

## 2015-04-23 DIAGNOSIS — M79642 Pain in left hand: Secondary | ICD-10-CM

## 2015-04-23 DIAGNOSIS — S6000XA Contusion of unspecified finger without damage to nail, initial encounter: Secondary | ICD-10-CM | POA: Diagnosis not present

## 2015-04-23 DIAGNOSIS — S60222A Contusion of left hand, initial encounter: Secondary | ICD-10-CM | POA: Diagnosis not present

## 2015-04-23 DIAGNOSIS — S61259A Open bite of unspecified finger without damage to nail, initial encounter: Secondary | ICD-10-CM | POA: Diagnosis not present

## 2015-04-23 DIAGNOSIS — S61452A Open bite of left hand, initial encounter: Secondary | ICD-10-CM

## 2015-04-23 DIAGNOSIS — L089 Local infection of the skin and subcutaneous tissue, unspecified: Secondary | ICD-10-CM

## 2015-04-23 DIAGNOSIS — W540XXA Bitten by dog, initial encounter: Principal | ICD-10-CM

## 2015-04-23 MED ORDER — AMOXICILLIN-POT CLAVULANATE 875-125 MG PO TABS: 1.0000 | ORAL_TABLET | Freq: Two times a day (BID) | ORAL | Status: DC

## 2015-04-23 MED ORDER — HYDROCODONE-ACETAMINOPHEN 5-325 MG PO TABS: 1.0000 | ORAL_TABLET | ORAL | Status: DC | PRN

## 2015-04-23 MED ORDER — TETANUS-DIPHTH-ACELL PERTUSSIS 5-2.5-18.5 LF-MCG/0.5 IM SUSP
0.5000 mL | Freq: Once | INTRAMUSCULAR | Status: AC
  Administered 2015-04-23: 0.5 mL via INTRAMUSCULAR

## 2015-04-23 NOTE — ED Notes (Signed)
Pt states she was bitten last night by her own dog. States she was trying to move her dog and it accidentally bit her on the left hand. States dog is current on vaccines. She is having some tenderness. No deep punctures...several small lacerations. [place] bite report sent.

## 2015-04-23 NOTE — ED Provider Notes (Addendum)
CSN: [id number]     Arrival date & time 04/23/15  1024 History   First MD Initiated Contact with Patient 04/23/15 1030     Chief Complaint  Patient presents with  . Animal Bite    HPI Pt states she was bitten last night by her own dog. States she was trying to move her dog and it accidentally bit her on the left hand. States dog is current on vaccines. She is having moderate pain (sharp and dull) diffusely left hand, severe pain with palpation or movement. He has not tried any particular treatment for this. No radiation of pain. No paresthesias or numbness or weakness. The dog bite caused several puncture wounds. [place] bite report sent. No fever or chills or nausea or vomiting. No cardiorespiratory symptoms. She is not sure when her last tetanus shot was. Past Medical History  Diagnosis Date  . Polycystic ovarian disease   . Diabetes mellitus without complication   . Infertility, female   . Umbilical hernia   . IBS (irritable bowel syndrome)    Past Surgical History  Procedure Laterality Date  . Tonsillectomy    . Cholecystectomy N/A 03/21/2013    Procedure: LAPAROSCOPIC CHOLECYSTECTOMY WITH INTRAOPERATIVE CHOLANGIOGRAM;  Surgeon: Axel FillerArmando Ramirez, MD;  Location: [facility] OR;  Service: General;  Laterality: N/A;   Family History  Problem Relation Age of Onset  . Asthma Mother   . Thyroid disease Mother   . Diabetes Father   . Migraines Sister   . Seizures Sister   . Rheum arthritis Sister    History  Substance Use Topics  . Smoking status: Current Every Day Smoker -- 0.50 packs/day    Types: Cigarettes  . Smokeless tobacco: Not on file     Comment: 5 cigarettes per day  . Alcohol Use: No   OB History    Gravida Para Term Preterm AB TAB SAB Ectopic Multiple Living   0              Review of Systems  All other systems reviewed and are negative.  Remainder of Review of Systems negative for acute change except as noted in the HPI.  Allergies  Glipizide  Home  Medications   Prior to Admission medications   Medication Sig Start Date End Date Taking? Authorizing Provider  amoxicillin-clavulanate (AUGMENTIN) 875-125 MG per tablet Take 1 tablet by mouth 2 (two) times daily. Take with food. 04/23/15   Lajean Manesavid Massey, MD  aspirin 81 MG tablet Take 81 mg by mouth daily.    Historical Provider, MD  Fish Oil-Cholecalciferol (FISH OIL + D3) 1000-1000 MG-UNIT CAPS Take 1,000 mg by mouth 3 (three) times daily.    Historical Provider, MD  HYDROcodone-acetaminophen (NORCO/VICODIN) 5-325 MG per tablet Take 1-2 tablets by mouth every 4 (four) hours as needed for severe pain. Take with food. 04/23/15   Lajean Manesavid Massey, MD  ibuprofen (ADVIL,MOTRIN) 200 MG tablet Take 400 mg by mouth every 6 (six) hours as needed. For pain    Historical Provider, MD  medroxyPROGESTERone (PROVERA) 10 MG tablet Take 1 tablet (10 mg total) by mouth daily. 08/09/14   Brook Rosalin HawkingE Amundson C Silva, MD  metFORMIN (GLUCOPHAGE-XR) 500 MG 24 hr tablet Take 500 mg by mouth. Takes 2 tablets twice daily    Historical Provider, MD  metoprolol succinate (TOPROL-XL) 12.5 mg TB24 24 hr tablet Take 12.5 mg by mouth daily. For heart palpitations    Historical Provider, MD  omeprazole (PRILOSEC) 40 MG capsule Take 40 mg by  mouth daily.    Historical Provider, MD  sitaGLIPtin (JANUVIA) 100 MG tablet Take 100 mg by mouth daily.    Historical Provider, MD  vitamin B-12 (CYANOCOBALAMIN) 1000 MCG tablet Take 1,000 mcg by mouth daily.    Historical Provider, MD   BP 138/85 mmHg  Pulse 98  Temp(Src) 98.2 F (36.8 C) (Oral)  SpO2 99%  LMP 02/03/2015 (Exact Date) Physical Exam  Constitutional: She is oriented to person, place, and time. She appears well-developed and well-nourished. No distress.  HENT:  Head: Normocephalic and atraumatic.  Eyes: Conjunctivae and EOM are normal. Pupils are equal, round, and reactive to light. No scleral icterus.  Neck: Normal range of motion.  Cardiovascular: Normal rate.     Pulmonary/Chest: Effort normal.  Abdominal: She exhibits no distension.  Musculoskeletal: Normal range of motion.       Left wrist: Normal. She exhibits normal range of motion, no tenderness, no bony tenderness and no swelling.       Left forearm: Normal. She exhibits no tenderness, no bony tenderness and no edema.       Hands: As depicted, left hand swollen, very tender, with several superficial puncture wounds. No foreign bodies or active bleeding seen. Left hand slightly warm. No fluctuance. No red streaks proximally or distally. Range of motion limited because of pain, but strength and sensation grossly intact. Capillary refill and neurovascular distally intact.  Neurological: She is alert and oriented to person, place, and time.  Skin: Skin is warm.  Psychiatric: She has a normal mood and affect.  Nursing note and vitals reviewed.   ED Course  Procedures (including critical care time) Labs Review Labs Reviewed - No data to display  Imaging Review Dg Hand Complete Left  04/23/2015   CLINICAL DATA:  dog bite on Lt hand, occurred yesterday. There is some swelling and scratches at the posterior surface of 1st-3rd metacarpal area. There is a small (1/4") puncture wound visible between the posterior surface between 1st-2nd metacarpal. Denies prior injury to Lt hand.  EXAM: LEFT HAND - COMPLETE 3+ VIEW  COMPARISON:  None.  FINDINGS: There is no evidence of fracture or dislocation. There is no evidence of arthropathy or other focal bone abnormality. Soft tissues are unremarkable. No radiodense foreign body or subcutaneous gas.  IMPRESSION: Negative.   Electronically Signed   By: Corlis Leak M.D.   On: 04/23/2015 11:27   MDM   1. Dog bite of left hand including fingers with infection, initial encounter   2. Contusion of left hand including fingers, initial encounter    X-ray left hand negative.--Reviewed with patient and gave her copy of x-ray report. Treatment options discussed, as well  as risks, benefits, alternatives. Patient voiced understanding and agreement with the following plans: DTaP Left hand cleansed with Hibiclens.  Polysporin and bulky Kerlex dressing applied and Coban applied. Wound care discussed and detailed handout given. Apply ice today and keep elevated to help swelling New Prescriptions   AMOXICILLIN-CLAVULANATE (AUGMENTIN) 875-125 MG PER TABLET    Take 1 tablet by mouth 2 (two) times daily. Take with food.   HYDROCODONE-ACETAMINOPHEN (NORCO/VICODIN) 5-325 MG PER TABLET    Take 1-2 tablets by mouth every 4 (four) hours as needed for severe pain. Take with food.   Tylenol or ibuprofen if needed for mild-to-moderate pain. Precautions discussed. Red flags discussed.--Explained that given her history of diabetes, she's at risk for infection, so take the Augmentin as prescribed, but advised her to look out for any signs or  symptoms of infection. Follow-up emergency room if any red flags.--Otherwise, follow up with PCP in 5-7 days. Questions invited and answered. Patient voiced understanding and agreement.  Lajean Manes, MD 04/23/15 [id number]  Lajean Manes, MD 04/23/15 [phone]

## 2015-05-18 ENCOUNTER — Ambulatory Visit: Payer: 59 | Admitting: Obstetrics and Gynecology

## 2015-05-19 ENCOUNTER — Ambulatory Visit: Payer: 59 | Admitting: Obstetrics and Gynecology

## 2015-07-11 ENCOUNTER — Telehealth: Payer: Self-pay

## 2015-07-11 NOTE — Telephone Encounter (Signed)
Left message to call Pamela Eaton at [phone].  Call to patient to schedule 6 month follow up PUS. Last PUS was performed on 01/20/2015 with Dr.Silva. Please see OV notes.

## 2015-07-11 NOTE — Telephone Encounter (Signed)
Spoke with patient. 6 month follow up PUS scheduled for 07/28/2015 at 8am with 8:30am consult with Dr.Silva. Patient verbalized understanding of the [facility] appointment cancellation policy. Advised will need to cancel or reschedule within 72 business hours of appointment (3 business days) or will have $100.00 late cancellation fee placed to account. Will need to be notified of precert cost.  Cc: Braxton Feathers  Routing to provider for final review. Patient agreeable to disposition. Will close encounter.   Patient aware provider will review message and nurse will return call if any additional advice or change of disposition.

## 2015-07-20 ENCOUNTER — Encounter: Payer: Self-pay | Admitting: Obstetrics and Gynecology

## 2015-07-20 ENCOUNTER — Ambulatory Visit (INDEPENDENT_AMBULATORY_CARE_PROVIDER_SITE_OTHER): Payer: 59 | Admitting: Obstetrics and Gynecology

## 2015-07-20 VITALS — BP 114/80 | HR 82 | Resp 14 | Ht 65.0 in | Wt 237.0 lb

## 2015-07-20 DIAGNOSIS — Z01419 Encounter for gynecological examination (general) (routine) without abnormal findings: Secondary | ICD-10-CM

## 2015-07-20 NOTE — Progress Notes (Signed)
29 y.o. G0P0 Married Caucasian female here for annual exam.    Comes in with menstrual record showing menses every 35 - 51 days.  Cycles last 5 - 8 days.  No significant pain.  Thinks she is not ovulating.  Hx of infertility.  Not really preventing pregnancy.   Has appointment for pelvic ultrasound for next week for echogenic focus of left ovary.   Stopping smoking and trying to loose weight.  PCP:   [facility]  Patient's last menstrual period was 07/04/2015.          Sexually active: Yes.    The current method of family planning is none.    Exercising: No.   Smoker:  Yes, 5 cigarettes per day.  Trying to quit.  Health Maintenance: Pap:   05/14/14 - WNL, negative HR HPV History of abnormal Pap:  Yes, 2012.  No treatment. MMG:   NA Colonoscopy:   NA BMD:   NA    TDaP:  2017 Screening Labs:  PCP does labs.   reports that she has been smoking Cigarettes.  She has been smoking about 0.50 packs per day. She does not have any smokeless tobacco history on file. She reports that she does not drink alcohol or use illicit drugs.  Past Medical History  Diagnosis Date  . Polycystic ovarian disease   . Diabetes mellitus without complication   . Infertility, female   . Umbilical hernia   . IBS (irritable bowel syndrome)     Past Surgical History  Procedure Laterality Date  . Tonsillectomy    . Cholecystectomy N/A 03/21/2013    Procedure: LAPAROSCOPIC CHOLECYSTECTOMY WITH INTRAOPERATIVE CHOLANGIOGRAM;  Surgeon: Axel Filler, MD;  Location: [facility] OR;  Service: General;  Laterality: N/A;    Current Outpatient Prescriptions  Medication Sig Dispense Refill  . aspirin 81 MG tablet Take 81 mg by mouth daily.    Marland Kitchen linagliptin (TRADJENTA) 5 MG TABS tablet Take 5 mg by mouth daily.    . metFORMIN (GLUCOPHAGE-XR) 500 MG 24 hr tablet Take 500 mg by mouth. Takes 2 tablets twice daily    . omeprazole (PRILOSEC) 40 MG capsule Take 40 mg by mouth daily.    . vitamin B-12  (CYANOCOBALAMIN) 1000 MCG tablet Take 1,000 mcg by mouth daily.    Marland Kitchen amoxicillin-clavulanate (AUGMENTIN) 875-125 MG per tablet Take 1 tablet by mouth 2 (two) times daily. Take with food. (Patient not taking: Reported on 07/20/2015) 20 tablet 0  . Fish Oil-Cholecalciferol (FISH OIL + D3) 1000-1000 MG-UNIT CAPS Take 1,000 mg by mouth 3 (three) times daily.    Marland Kitchen HYDROcodone-acetaminophen (NORCO/VICODIN) 5-325 MG per tablet Take 1-2 tablets by mouth every 4 (four) hours as needed for severe pain. Take with food. (Patient not taking: Reported on 07/20/2015) 12 tablet 0  . ibuprofen (ADVIL,MOTRIN) 200 MG tablet Take 400 mg by mouth every 6 (six) hours as needed. For pain    . medroxyPROGESTERone (PROVERA) 10 MG tablet Take 1 tablet (10 mg total) by mouth daily. (Patient not taking: Reported on 07/20/2015) 10 tablet 0  . metoprolol succinate (TOPROL-XL) 12.5 mg TB24 24 hr tablet Take 12.5 mg by mouth daily. For heart palpitations    . sitaGLIPtin (JANUVIA) 100 MG tablet Take 100 mg by mouth daily.     No current facility-administered medications for this visit.    Family History  Problem Relation Age of Onset  . Asthma Mother   . Thyroid disease Mother   . Diabetes Father   .  Migraines Sister   . Seizures Sister   . Rheum arthritis Sister     ROS:  Pertinent items are noted in HPI.  Otherwise, a comprehensive ROS was negative.  Exam:   BP 114/80 mmHg  Pulse 82  Resp 14  Ht  (1.651 m)  Wt 237 lb (107.502 kg)  BMI 39.44 kg/m2  LMP 07/04/2015    General appearance: alert, cooperative and appears stated age Head: Normocephalic, without obvious abnormality, atraumatic Neck: no adenopathy, supple, symmetrical, trachea midline and thyroid normal to inspection and palpation Lungs: clear to auscultation bilaterally Breasts: normal appearance, no masses or tenderness, Inspection negative, No nipple retraction or dimpling, No nipple discharge or bleeding, No axillary or supraclavicular  adenopathy Heart: regular rate and rhythm Abdomen: soft, non-tender; bowel sounds normal; no masses,  no organomegaly Extremities: extremities normal, atraumatic, no cyanosis or edema Skin: Skin color, texture, turgor normal. No rashes or lesions Lymph nodes: Cervical, supraclavicular, and axillary nodes normal. No abnormal inguinal nodes palpated Neurologic: Grossly normal  Pelvic: External genitalia:  no lesions              Urethra:  normal appearing urethra with no masses, tenderness or lesions              Bartholins and Skenes: normal                 Vagina: normal appearing vagina with normal color and discharge, no lesions              Cervix: no lesions              Pap taken: Yes.   Bimanual Exam:  Uterus:  normal size, contour, position, consistency, mobility, non-tender              Adnexa: normal adnexa and no mass, fullness, tenderness              Rectovaginal: No..  Confirms.              Anus:  normal sphincter tone, no lesions  Chaperone was present for exam.  Assessment:   Well woman visit with normal exam. Hx. PCOS. DM.  Spontaneous menstrual cycles.  Hx of infertility.  Tobacco use.   Plan: Yearly mammogram recommended after age 85.  Recommended self breast exam.  Pap and HR HPV as above. Discussed Calcium, Vitamin D, regular exercise program including cardiovascular and weight bearing exercise. Labs performed.  No..     Refills given on medications.  No..    Recommendation for PNV. Discussed importance of good glucose control prior to conception to reduce risk congenital anomalies. Discussed referral for fertility care if desired. Support and counseling for tobacco cessation.  Follow up annually and prn.     After visit summary provided.

## 2015-07-20 NOTE — Patient Instructions (Signed)

## 2015-07-21 ENCOUNTER — Telehealth: Payer: Self-pay | Admitting: Obstetrics and Gynecology

## 2015-07-21 NOTE — Telephone Encounter (Signed)
Patient called and cancelled her upcoming appointment for an ultrasound for 07/28/15. Please call to reschedule. She is getting new insurance and will provide that to [place] when she gets it.

## 2015-07-25 LAB — IPS PAP TEST WITH HPV

## 2015-07-28 ENCOUNTER — Other Ambulatory Visit: Payer: 59

## 2015-07-28 ENCOUNTER — Other Ambulatory Visit: Payer: 59 | Admitting: Obstetrics and Gynecology

## 2015-08-10 ENCOUNTER — Telehealth: Payer: Self-pay | Admitting: Obstetrics and Gynecology

## 2015-08-10 NOTE — Telephone Encounter (Signed)
Left message regarding upcoming appointment has been canceled and needs to be rescheduled. °

## 2015-08-22 NOTE — Telephone Encounter (Signed)
This patient has not rescheduled yet. Do you want to call the patient to see if she is ready to schedule? If not, the provider will need to know this patient has not followed up.

## 2015-08-23 ENCOUNTER — Encounter: Payer: Self-pay | Admitting: Obstetrics and Gynecology

## 2015-08-23 NOTE — Telephone Encounter (Signed)
Patient had PUS on 01/20/2015 for RLQ pain. Results seen below. Patient was to follow up in 6 months. Patient cancelled follow up scheduled for 07/28/2015. Declines to reschedule at this time. Routing to Dr.Silva for review.  Uterus with no masses.  EMS 4.17 mm.  Ovaries with multiple follicles and left ovary with 8 x 5 mm echogenic focus. Possible polycytic ovaries.  No free fluid.

## 2015-08-23 NOTE — Telephone Encounter (Signed)
Spoke with patient and she states she is not interested in rescheduling her PUS at this time. Her original 6 month follow up PUS was scheduled 07/28/15 and then cancelled stating she would call to reschedule. When asked if she would be rescheduling in near future she stated "no, i think it was just to recheck a spot i had on my ovary and I think its fine".

## 2015-08-24 NOTE — Telephone Encounter (Signed)
I do recommend a follow up ultrasound of the ovaries to document stability of this more solid area seen on the left ovary.  I cannot tell the patient if it is significant or not without a follow up exam to tell us if it is getting larger or not.

## 2015-08-25 ENCOUNTER — Telehealth: Payer: Self-pay | Admitting: Obstetrics and Gynecology

## 2015-08-25 NOTE — Telephone Encounter (Signed)
Records faxed to [facility] for Reproductive Medicine [phone]

## 2015-08-25 NOTE — Telephone Encounter (Signed)
Thank you. Encounter closed. 

## 2015-08-25 NOTE — Telephone Encounter (Signed)
Spoke with patient. Advised of message as seen below from Dr.Silva. Patient states she is going to see a reproductive specialist tomorrow and was advised they may need to perform an ultrasound. Requesting to have these results sent to Dr.Silva so she does not have to have this performed twice. Fax number provided to the office. Patient will return call if ultrasound will not be performed by specialist so that she can have follow up with Dr.Silva.  Routing to provider for final review. Patient agreeable to disposition. Will close encounter.

## 2015-09-05 NOTE — Telephone Encounter (Signed)
This patient has not rescheduled her ultrasound that she cancelled for 07/28/15. Please advise whether further follow up is needed?

## 2015-09-05 NOTE — Telephone Encounter (Signed)
No follow up is needed at this time.  She is seeing reproductive endocrinology/infertility.  I have closed encounter.

## 2015-10-05 ENCOUNTER — Telehealth: Payer: Self-pay

## 2015-10-05 ENCOUNTER — Encounter: Payer: Self-pay | Admitting: Obstetrics and Gynecology

## 2015-10-05 NOTE — Telephone Encounter (Signed)
Non-Urgent Medical Question  Message [phone]   From  Netta NeatMeghan Grunert   To  Patton SallesBrook E Amundson C Silva, MD   Sent  10/05/2015 1:10 PM     Hi,   I was wondering if it would be possible to get my blood drawn and my hormones checked? I haven't had it done in a few years and I'd like to see where my levels, specifically testerone, is.   Let me know.   Thanks,  Pamela Eaton      Responsible Party    Pool - [facility] Clinical Pool No one has taken responsibility for this message.     No actions have been taken on this message.     Dr.Silva, okay to schedule lab appointment for this patient? Patient was last seen on 07/20/2015 for aex.

## 2015-10-05 NOTE — Telephone Encounter (Signed)
Telephone encounter created to discuss mychart message with Dr.Silva. 

## 2015-10-05 NOTE — Telephone Encounter (Signed)
Spoke with patient. Advised of message as seen below from Dr.Silva. Patient is agreeable. OV scheduled for tomorrow 11/10 at 3 pm with Dr.Silva. Agreeable to date and time.  Routing to provider for final review. Patient agreeable to disposition. Will close encounter.

## 2015-10-05 NOTE — Telephone Encounter (Signed)
Please ask patient if it would be ok for us to have a brief talking visit before testing her hormones on the same day. I want to be sure I am getting all of the testing she needs.  I recall that she was also planning to see an infertility specialist.

## 2015-10-06 ENCOUNTER — Encounter: Payer: Self-pay | Admitting: Obstetrics and Gynecology

## 2015-10-06 ENCOUNTER — Ambulatory Visit (INDEPENDENT_AMBULATORY_CARE_PROVIDER_SITE_OTHER): Payer: BLUE CROSS/BLUE SHIELD | Admitting: Obstetrics and Gynecology

## 2015-10-06 VITALS — BP 130/84 | HR 80 | Ht 65.0 in | Wt 236.2 lb

## 2015-10-06 DIAGNOSIS — E282 Polycystic ovarian syndrome: Secondary | ICD-10-CM

## 2015-10-06 NOTE — Telephone Encounter (Signed)
Patient scheduled for OV with Dr.Silva on 10/06/2015. Please see telephone encounter.  Routing to provider for final review. Patient agreeable to disposition. Will close encounter.

## 2015-10-06 NOTE — Progress Notes (Signed)
Patient ID: Pamela Eaton, female   DOB: [birth year], 29 y.o.   MRN: [id number] GYNECOLOGY  VISIT   HPI: 29 y.o.   Married  Caucasian  female   G0P0 with Patient's last menstrual period was 10/06/2015 (exact date).   here for consultation.   Wants testosterone level checked.  Has PCOS and has not had testosterone check in a while.  Continues to have increased hair growth.  Friend of hers is doing evaluation for tumors because of having increased testosterone level, so patient is worried about herself.  [facility] consultation.  Confirmation that she is ovulating on Letrozol. Patient is on Letrozol with Ovidrel (HCG injection).  No pregnancy the first month.  Cycle started today.   [facility] and told the area of the left ovary was still there and did not have abnormal blood flow and was small still.  HGb A1C is below 7.  Patient is not back on insulin in addition to her metformin.  Only taking insulin if she is eating a lot of carbs. Having elevated blood sugar prior to ovulating.   Told her thyroid was high (TSH 4.6) couple of months ago according to her PCP.  Will follow up again in December 28.   GYNECOLOGIC HISTORY: Patient's last menstrual period was 10/06/2015 (exact date). Contraception:none Menopausal hormone therapy: n/a Last mammogram: n/a Last pap smear: 07-21-15 Neg:Neg HR HPV        OB History    Gravida Para Term Preterm AB TAB SAB Ectopic Multiple Living   0                  Patient Active Problem List   Diagnosis Date Noted  . Cholecystitis with cholelithiasis 03/21/2013  . PCOS (polycystic ovarian syndrome) 03/21/2013  . Type 2 diabetes mellitus (HCC) 03/21/2013    Past Medical History  Diagnosis Date  . Polycystic ovarian disease   . Diabetes mellitus without complication (HCC)   . Infertility, female   . Umbilical hernia   . IBS (irritable bowel syndrome)     Past Surgical History  Procedure Laterality  Date  . Tonsillectomy    . Cholecystectomy N/A 03/21/2013    Procedure: LAPAROSCOPIC CHOLECYSTECTOMY WITH INTRAOPERATIVE CHOLANGIOGRAM;  Surgeon: Axel FillerArmando Ramirez, MD;  Location: [facility] OR;  Service: General;  Laterality: N/A;    Current Outpatient Prescriptions  Medication Sig Dispense Refill  . ibuprofen (ADVIL,MOTRIN) 200 MG tablet Take 400 mg by mouth every 6 (six) hours as needed. For pain    . letrozole (FEMARA) 2.5 MG tablet Take 3 tablets cycle day 2  2  . linagliptin (TRADJENTA) 5 MG TABS tablet Take 5 mg by mouth. Takes 3 tablets cycle day 2    . metFORMIN (GLUCOPHAGE-XR) 500 MG 24 hr tablet Take 500 mg by mouth. Takes 2000mg  at night    . omeprazole (PRILOSEC) 40 MG capsule Take 40 mg by mouth daily.    Marland Kitchen. OVIDREL 250 MCG/0.5ML injection Inject 1 Syringe into the skin every 30 (thirty) days.  2   No current facility-administered medications for this visit.     ALLERGIES: Glipizide  Family History  Problem Relation Age of Onset  . Asthma Mother   . Thyroid disease Mother   . Diabetes Father   . Migraines Sister   . Seizures Sister   . Rheum arthritis Sister     Social History   Social History  . Marital Status: Married    Spouse  Name: N/A  . Number of Children: N/A  . Years of Education: N/A   Occupational History  . Not on file.   Social History Main Topics  . Smoking status: Light Tobacco Smoker -- 0.50 packs/day    Types: Cigarettes  . Smokeless tobacco: Not on file     Comment: trying to quit--goes days without smoking  . Alcohol Use: No  . Drug Use: No  . Sexual Activity:    Partners: Male    Birth Control/ Protection: None   Other Topics Concern  . Not on file   Social History Narrative    ROS:  Pertinent items are noted in HPI.  PHYSICAL EXAMINATION:    BP 130/84 mmHg  Pulse 80  Ht  (1.651 m)  Wt 236 lb 3.2 oz (107.14 kg)  BMI 39.31 kg/m2  LMP 10/06/2015 (Exact Date)    General appearance: alert, cooperative and appears stated  age HEENT:  Facial hair noted.  Appears shaven.   ASSESSMENT  PCOS. Undergoing fertility care.  Stable echogenic area of the left ovary.  Type II diabetes.  Now on insulin and metformin.   PLAN  Will check testosterone - free, SHBG, and total testosterone.  Discussion of PCOS. Discussion of pregnancy care and options for teams in [place] including the [facility] group who does consults at [facility].   An After Visit Summary was printed and given to the patient.  _15____ minutes face to face time of which over 50% was spent in counseling.

## 2015-10-07 LAB — TESTOSTERONE, FREE, TOTAL, SHBG
SEX HORMONE BINDING: 11 nmol/L — AB (ref 17–124)
TESTOSTERONE FREE: 12.4 pg/mL — AB (ref 0.6–6.8)
Testosterone-% Free: 2.9 % — ABNORMAL HIGH (ref 0.4–2.4)
Testosterone: 42 ng/dL (ref 10–70)

## 2016-03-20 ENCOUNTER — Encounter: Payer: Self-pay | Admitting: Obstetrics and Gynecology

## 2016-03-20 ENCOUNTER — Telehealth: Payer: Self-pay

## 2016-03-20 NOTE — Telephone Encounter (Signed)
Spoke with patient regarding [employer] Group[employer]chart message. Please see telephone encounter dated 03/20/2016.   Routing to provider for final review. Patient agreeable to disposition. Will close encounter.

## 2016-03-20 NOTE — Telephone Encounter (Signed)
Non-Urgent Medical Question  Message [id number]   From  Netta NeatMeghan Abid   To  Patton SallesBrook E Amundson C Silva, MD   Sent  03/20/2016 10:24 AM     Hi,  I'm not sure if I should make an appt so thought I'd email first. I had a period 1/8-1/15 and then stopped the fertility treatments. I didn't get one again until 4/1-4/7. 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   Your guidance would be appreciated.   Thanks,  Pamela Eaton      Responsible Party    Pool - [facility] Pool No one has taken responsibility for this message.     No actions have been taken on this message.     Spoke with patient regarding mychart message as seen above. Patient states that she stopped fertility treatments in December or early January. Had a cycle from 12/04/2015-12/11/2015. Reports bleeding during this cycle was "normal." Denies any heavy bleeding. Patient did not have a cycle in February or March. Had another cycle 02/25/2016-03/02/2016. Again reports this was a "normal" cycle. Denies any heavy bleeding. Patient has started bleeding again on 03/16/2016 and is still having bleeding today. Reports bleeding is "normal." Changing her tampon every 4-6 hours. Denies any pelvic pain, light headedness, or dizziness. Reports mild cramping and "ovulation pain" last week, but these have subsided. Advised she will need to be seen in the office with Dr.Silva for further evaluation. She is agreeable and verbalizes understanding. Appointment scheduled for 03/22/2016 at 2 pm. She is agreeable to date and time.  Routing to provider for final review. Patient agreeable to disposition. Will close encounter.

## 2016-03-22 ENCOUNTER — Encounter: Payer: Self-pay | Admitting: Obstetrics and Gynecology

## 2016-03-22 ENCOUNTER — Ambulatory Visit (INDEPENDENT_AMBULATORY_CARE_PROVIDER_SITE_OTHER): Payer: BLUE CROSS/BLUE SHIELD | Admitting: Obstetrics and Gynecology

## 2016-03-22 VITALS — BP 118/76 | HR 88 | Ht 65.0 in | Wt 251.0 lb

## 2016-03-22 DIAGNOSIS — F439 Reaction to severe stress, unspecified: Secondary | ICD-10-CM

## 2016-03-22 DIAGNOSIS — N926 Irregular menstruation, unspecified: Secondary | ICD-10-CM | POA: Diagnosis not present

## 2016-03-22 DIAGNOSIS — Z658 Other specified problems related to psychosocial circumstances: Secondary | ICD-10-CM | POA: Diagnosis not present

## 2016-03-22 DIAGNOSIS — R635 Abnormal weight gain: Secondary | ICD-10-CM | POA: Diagnosis not present

## 2016-03-22 LAB — POCT URINE PREGNANCY: Preg Test, Ur: NEGATIVE

## 2016-03-22 MED ORDER — MEDROXYPROGESTERONE ACETATE 10 MG PO TABS: 10.0000 mg | ORAL_TABLET | Freq: Every day | ORAL | Status: DC

## 2016-03-22 NOTE — Progress Notes (Signed)
Patient ID: Pamela Eaton, female   DOB: [birth year], 30 y.o.   MRN: [id number] GYNECOLOGY  VISIT   HPI: 30 y.o.   Married  Caucasian  female   G0P0 with Patient's last menstrual period was 03/16/2016 (exact date).   here for abnormal uterine bleeding.   Patient states she stopped fertillity treatments end of December/first of January. Had a cycle 12-04-15 to 12-14-15.  Next cycle wasn't until 02-25-16 to 03-02-16.  She then began spotting 03-16-16 and progressively got heavier and is still bleeding today. Pad change 4 - 5 times a day.  No painful cycles.  Did Letrozol and hCG injection with her fertility tx at [facility].   Notes some weight gain.   Tired, stressed, and overworked.  Lots of changes at work. Has some OCD tendencies.   Hx PCOS.  Last hemoglobin A1C is 6.   Having hip pain after a fall.   UPT: Negative  GYNECOLOGIC HISTORY: Patient's last menstrual period was 03/16/2016 (exact date). Contraception:  None Menopausal hormone therapy:  n/a Last mammogram:  n/a Last pap smear:  07-21-15 Neg:Neg HR HPV        OB History    Gravida Para Term Preterm AB TAB SAB Ectopic Multiple Living   0                  Patient Active Problem List   Diagnosis Date Noted  . Cholecystitis with cholelithiasis 03/21/2013  . PCOS (polycystic ovarian syndrome) 03/21/2013  . Type 2 diabetes mellitus (HCC) 03/21/2013    Past Medical History  Diagnosis Date  . Polycystic ovarian disease   . Diabetes mellitus without complication (HCC)   . Infertility, female   . Umbilical hernia   . IBS (irritable bowel syndrome)     Past Surgical History  Procedure Laterality Date  . Tonsillectomy    . Cholecystectomy N/A 03/21/2013    Procedure: LAPAROSCOPIC CHOLECYSTECTOMY WITH INTRAOPERATIVE CHOLANGIOGRAM;  Surgeon: Axel FillerArmando Ramirez, MD;  Location: [facility] OR;  Service: General;  Laterality: N/A;    Current Outpatient Prescriptions  Medication Sig Dispense Refill  . ibuprofen (ADVIL,MOTRIN) 800 MG tablet  Take 1 tablet by mouth as needed.  0  . letrozole (FEMARA) 2.5 MG tablet Take 3 tablets cycle day 2  2  . linagliptin (TRADJENTA) 5 MG TABS tablet Take 5 mg by mouth. Takes 3 tablets cycle day 2    . metFORMIN (GLUCOPHAGE-XR) 500 MG 24 hr tablet Take 500 mg by mouth. Takes 2000mg  at night    . NOVOLOG FLEXPEN 100 UNIT/ML FlexPen Takes with each meal Takes 10-15 units  6  . omeprazole (PRILOSEC) 40 MG capsule Take 40 mg by mouth daily.    Marland Kitchen. OVIDREL 250 MCG/0.5ML injection Inject 1 Syringe into the skin every 30 (thirty) days.  2   No current facility-administered medications for this visit.     ALLERGIES: Glipizide  Family History  Problem Relation Age of Onset  . Asthma Mother   . Thyroid disease Mother   . Diabetes Father   . Migraines Sister   . Seizures Sister   . Rheum arthritis Sister     Social History   Social History  . Marital Status: Married    Spouse Name: N/A  . Number of Children: N/A  . Years of Education: N/A   Occupational History  . Not on file.   Social History Main Topics  . Smoking status: Light Tobacco Smoker -- 0.50 packs/day    Types: Cigarettes  . Smokeless  tobacco: Not on file     Comment: trying to quit--goes days without smoking  . Alcohol Use: No  . Drug Use: No  . Sexual Activity:    Partners: Male    Birth Control/ Protection: None   Other Topics Concern  . Not on file   Social History Narrative    ROS:  Pertinent items are noted in HPI.  PHYSICAL EXAMINATION:    BP 118/76 mmHg  Pulse 88  Ht  (1.651 m)  Wt 251 lb (113.853 kg)  BMI 41.77 kg/m2  LMP 03/16/2016 (Exact Date)    General appearance: alert, cooperative and appears stated age    Pelvic: External genitalia:  no lesions              Urethra:  normal appearing urethra with no masses, tenderness or lesions              Bartholins and Skenes: normal                 Vagina: normal appearing vagina with normal color and discharge, no lesions              Cervix:  no lesions and bleeding noted.          Bimanual Exam:  Uterus:  normal size, contour, position, consistency, mobility, non-tender              Adnexa: normal adnexa and no mass, fullness, tenderness           Chaperone was present for exam.  ASSESSMENT  Irregular menses.  PCOS.   Negative UPT. Situational stress.   Weight gain.   PLAN  Probable anovulatory bleeding.  Provera 10 mg po x 10 days.  Discussed [facility] weight management program.  Discussed counseling for stress relief if desires.     An After Visit Summary was printed and given to the patient.  _15_____ minutes face to face time of which over 50% was spent in counseling.

## 2016-03-22 NOTE — Patient Instructions (Signed)
Consider [facility] for a weight loss program.

## 2016-07-20 ENCOUNTER — Ambulatory Visit: Payer: 59 | Admitting: Obstetrics and Gynecology

## 2016-08-03 ENCOUNTER — Emergency Department (INDEPENDENT_AMBULATORY_CARE_PROVIDER_SITE_OTHER): Payer: 59

## 2016-08-03 ENCOUNTER — Emergency Department
Admission: EM | Admit: 2016-08-03 | Discharge: 2016-08-03 | Disposition: A | Discharge status: Home / Self Care | Payer: 59 | Source: Home / Self Care | Attending: Family Medicine | Admitting: Family Medicine

## 2016-08-03 ENCOUNTER — Encounter: Payer: Self-pay | Admitting: Emergency Medicine

## 2016-08-03 DIAGNOSIS — W19XXXA Unspecified fall, initial encounter: Secondary | ICD-10-CM | POA: Diagnosis not present

## 2016-08-03 DIAGNOSIS — S6992XA Unspecified injury of left wrist, hand and finger(s), initial encounter: Secondary | ICD-10-CM

## 2016-08-03 DIAGNOSIS — M79642 Pain in left hand: Secondary | ICD-10-CM | POA: Diagnosis not present

## 2016-08-03 DIAGNOSIS — W010XXA Fall on same level from slipping, tripping and stumbling without subsequent striking against object, initial encounter: Secondary | ICD-10-CM

## 2016-08-03 NOTE — ED Triage Notes (Signed)
Pt states she fell and landed  on her left hand  x2 days ago. Hand is bruised and swollen. Denies pain. States she broke her left wrist about 20 years ago.

## 2016-08-03 NOTE — ED Provider Notes (Signed)
CSN: [id number]     Arrival date & time 08/03/16  1727 History   First MD Initiated Contact with Patient 08/03/16 1747     Chief Complaint  Patient presents with  . Hand Pain   (Consider location/radiation/quality/duration/timing/severity/associated sxs/prior Treatment) HPI  Pamela Eaton is a 30 y.o. female presenting to UC with c/o Left hand pain, swelling and bruising, worsening over the last 2 days but reports falling on it this past weekend, while dancing with her husband in backyard. Pt notes she was drinking some alcohol, which she does not do often, and fell. She is not sure how she landed but believes her hands were out in front of her. Pain is aching and sore with palpation, minimal, but no pain at rest. No pain medication taken PTA. Pt is Right hand dominant. Hx of Left wrist fracture about 20 years ago and reports minimal pain with that fracture. Denies hx of surgeries on Left hand or wrist. No other injuries.    Past Medical History:  Diagnosis Date  . Diabetes mellitus without complication (HCC)   . IBS (irritable bowel syndrome)   . Infertility, female   . Polycystic ovarian disease   . Umbilical hernia    Past Surgical History:  Procedure Laterality Date  . CHOLECYSTECTOMY N/A 03/21/2013   Procedure: LAPAROSCOPIC CHOLECYSTECTOMY WITH INTRAOPERATIVE CHOLANGIOGRAM;  Surgeon: Axel Filler, MD;  Location: [facility] OR;  Service: General;  Laterality: N/A;  . TONSILLECTOMY     Family History  Problem Relation Age of Onset  . Asthma Mother   . Thyroid disease Mother   . Diabetes Father   . Migraines Sister   . Seizures Sister   . Rheum arthritis Sister    Social History  Substance Use Topics  . Smoking status: Light Tobacco Smoker    Packs/day: 0.50    Types: Cigarettes  . Smokeless tobacco: Never Used     Comment: trying to quit--goes days without smoking  . Alcohol use No   OB History    Gravida Para Term Preterm AB Living   0             SAB TAB Ectopic  Multiple Live Births                 Review of Systems  Musculoskeletal: Positive for arthralgias, joint swelling and myalgias.  Skin: Positive for color change. Negative for rash and wound.  Neurological: Negative for weakness and numbness.    Allergies  Glipizide  Home Medications   Prior to Admission medications   Medication Sig Start Date End Date Taking? Authorizing Provider  ibuprofen (ADVIL,MOTRIN) 800 MG tablet Take 1 tablet by mouth as needed. 12/18/15   Historical Provider, MD  letrozole [facility]) 2.5 MG tablet Take 3 tablets cycle day 2 10/03/15   Historical Provider, MD  linagliptin (TRADJENTA) 5 MG TABS tablet Take 5 mg by mouth. Takes 3 tablets cycle day 2    Historical Provider, MD  medroxyPROGESTERone (PROVERA) 10 MG tablet Take 1 tablet (10 mg total) by mouth daily. Take for 10 days. 03/22/16   Brook Rosalin Hawking, MD  metFORMIN (GLUCOPHAGE-XR) 500 MG 24 hr tablet Take 500 mg by mouth. Takes 2000mg  at night    Historical Provider, MD  NOVOLOG FLEXPEN 100 UNIT/ML FlexPen Takes with each meal Takes 10-15 units 03/21/16   Historical Provider, MD  omeprazole (PRILOSEC) 40 MG capsule Take 40 mg by mouth daily.    Historical Provider, MD  OVIDREL 250 MCG/0.5ML injection  Inject 1 Syringe into the skin every 30 (thirty) days. 10/04/15   Historical Provider, MD   Meds Ordered and Administered this Visit  Medications - No data to display  BP 137/83 (BP Location: Right Arm)   Pulse 71   Temp 97.8 F (36.6 C) (Oral)   Wt 250 lb (113.4 kg)   LMP  (LMP Unknown) Comment: PCOS  BMI 41.60 kg/m  No data found.   Physical Exam  Constitutional: She is oriented to person, place, and time. She appears well-developed and well-nourished.  HENT:  Head: Normocephalic and atraumatic.  Eyes: EOM are normal.  Neck: Normal range of motion.  Cardiovascular: Normal rate.   Pulses:      Radial pulses are 2+ on the left side.  Pulmonary/Chest: Effort normal.  Musculoskeletal: Normal  range of motion. She exhibits edema and tenderness. She exhibits no deformity.  Left hand and wrist: mild edema to dorsal radial aspect. Full ROM. Tenderness to dorsal aspect over 1st and 2nd metacarpal. Tenderness in anatomic snuffbox.  4/5 grip strength compared to Right hand.   Neurological: She is alert and oriented to person, place, and time.  Skin: Skin is warm and dry. Capillary refill takes less than 2 seconds.  Left hand: skin in tact, mild ecchymosis to dorsal aspect.  Psychiatric: She has a normal mood and affect. Her behavior is normal.  Nursing note and vitals reviewed.   Urgent Care Course   Clinical Course    Procedures (including critical care time)  Labs Review Labs Reviewed - No data to display  Imaging Review Dg Wrist Complete Left  Result Date: 08/03/2016 CLINICAL DATA:  Fall 1 week ago and injured left wrist. Pain near navicular with overlying bruising. EXAM: LEFT WRIST - COMPLETE 3+ VIEW COMPARISON:  04/23/2015 FINDINGS: There is no evidence of fracture or dislocation. There is no evidence of arthropathy or other focal bone abnormality. Soft tissues are unremarkable. IMPRESSION: Negative. Electronically Signed   By: Kennith CenterEric  Mansell M.D.   On: 08/03/2016 18:20   Dg Hand Complete Left  Result Date: 08/03/2016 CLINICAL DATA:  Fall 1 week ago, left hand injury EXAM: LEFT HAND - COMPLETE 3+ VIEW COMPARISON:  None. FINDINGS: Three views of the left hand submitted. No acute fracture or subluxation. No radiopaque foreign body. IMPRESSION: Negative. Electronically Signed   By: Natasha MeadLiviu  Pop M.D.   On: 08/03/2016 18:17     MDM   1. Left hand pain   2. Left wrist injury, initial encounter   3. Fall from slip, trip, or stumble, initial encounter    Pt c/o Left hand and wrist pain from trip and fall.  Mild swelling with bruising and tenderness noted on exam. Full ROM, sensation and circulation in tact.  Plain films: Negative for fracture or dislocation.   Will treat as  sprain.  Pt placed in thumb-spica splint.  Encouraged to f/u with Sports Medicine in 1-2 weeks if not improving, sooner if worsening. Patient verbalized understanding and agreement with treatment plan.     Junius FinnerErin O'Malley, PA-C 08/03/16 1845

## 2016-08-05 ENCOUNTER — Telehealth: Payer: Self-pay | Admitting: Emergency Medicine

## 2016-08-05 NOTE — Telephone Encounter (Signed)
Left a message for patient to return a call R/T her last visit.

## 2016-08-16 ENCOUNTER — Ambulatory Visit: Payer: 59 | Admitting: Obstetrics and Gynecology

## 2016-09-04 ENCOUNTER — Encounter: Payer: Self-pay | Admitting: Obstetrics and Gynecology

## 2016-10-03 NOTE — Progress Notes (Signed)
30 y.o. 300P0 Married Caucasian female here for annual exam.    Menses recently are as follows: Started 10/19 - 29.  Started 11/6 - now.  Got her menses for the first time since April.  Does not mind taking the Provera for 10 days.  Does not want to take medication daily for cycle regulation.  Really concerned about her weight and diabetes and wants to get a hold of everything. Joined [employer].  Lost 4 pounds.  Last Hgb A1C is 6.0.    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   Had [facility] at [facility].  Did Letrezol/hCG and timed intercourse.   UPT today - negative.   PCP:   Florene GlenKatie Wingate, NP  Patient's last menstrual period was 10/01/2016.           Sexually active: Yes.    The current method of family planning is none.    Exercising: No.  The patient does not participate in regular exercise at present. Smoker:  no  Health Maintenance: Pap:  07-21-15 Neg:Neg HR HPV History of abnormal Pap:  Yes, 2012 but no treatment MMG:  n/a Colonoscopy:  n/a BMD:   n/a  Result  n/a TDaP:  2017   Screening Labs:   Urine today: +1 RBC - patient is on cycle   reports that she quit smoking about 3 weeks ago. She smoked 0.00 packs per day. She has never used smokeless tobacco. She reports that she does not drink alcohol or use drugs.  Past Medical History:  Diagnosis Date  . Diabetes mellitus without complication (HCC)   . IBS (irritable bowel syndrome)   . Infertility, female   . Polycystic ovarian disease   . Umbilical hernia     Past Surgical History:  Procedure Laterality Date  . CHOLECYSTECTOMY N/A 03/21/2013   Procedure: LAPAROSCOPIC CHOLECYSTECTOMY WITH INTRAOPERATIVE CHOLANGIOGRAM;  Surgeon: Axel FillerArmando Ramirez, MD;  Location: [facility] OR;  Service: General;  Laterality: N/A;  . TONSILLECTOMY      Current Outpatient Prescriptions  Medication Sig Dispense Refill  . ibuprofen (ADVIL,MOTRIN) 800 MG tablet Take 1 tablet by mouth as needed.  0  .  metFORMIN (GLUCOPHAGE-XR) 500 MG 24 hr tablet Take 500 mg by mouth. Takes 2000mg  at night    . NOVOLOG FLEXPEN 100 UNIT/ML FlexPen Takes with each meal Takes 10-15 units  6  . OMEPRAZOLE PO Take 40 mg by mouth daily.     No current facility-administered medications for this visit.     Family History  Problem Relation Age of Onset  . Asthma Mother   . Thyroid disease Mother   . Diabetes Father   . Migraines Sister   . Seizures Sister   . Rheum arthritis Sister     ROS:  Pertinent items are noted in HPI.  Otherwise, a comprehensive ROS was negative.  Exam:   BP 116/70 (BP Location: Right Arm, Patient Position: Sitting, Cuff Size: Normal)   Pulse 68   Resp 16   Ht 5' 5.25" (1.657 m)   Wt 244 lb (110.7 kg)   LMP 10/01/2016   BMI 40.29 kg/m     General appearance: alert, cooperative and appears stated age Head: Normocephalic, without obvious abnormality, atraumatic Neck: no adenopathy, supple, symmetrical, trachea midline and thyroid normal to inspection and palpation Lungs: clear to auscultation bilaterally Breasts: normal appearance, no masses or tenderness, No nipple retraction or dimpling, No nipple discharge or bleeding, No axillary or supraclavicular adenopathy Heart: regular  rate and rhythm Abdomen: soft, non-tender; no masses, no organomegaly Extremities: extremities normal, atraumatic, no cyanosis or edema Skin: Skin color, texture, turgor normal. No rashes or lesions.  Significant facial hair. Lymph nodes: Cervical, supraclavicular, and axillary nodes normal. No abnormal inguinal nodes palpated Neurologic: Grossly normal  Pelvic: External genitalia:  no lesions              Urethra:  normal appearing urethra with no masses, tenderness or lesions              Bartholins and Skenes: normal                 Vagina: normal appearing vagina with normal color and discharge, no lesions              Cervix: no lesions.  Vaginal bleeding noted.               Pap taken:  No. Bimanual Exam:  Uterus:  normal size, contour, position, consistency, mobility, non-tender              Adnexa: no mass, fullness, tenderness             Chaperone was present for exam.  Assessment:   Well woman visit with normal exam.  Periods of amenorrhea with irregular cycles. PCOS with hirsutism. DM.  Obesity.  Hx infertility. Prior abnormal pap in 2012.  Plan: Yearly mammogram recommended after age 30.  Recommended self breast exam.  Pap and HR HPV as above. Guidelines for Calcium, Vitamin D, regular exercise program including cardiovascular and weight bearing exercise. Referral to Dr. Romero BellingBalin, endocrinology for DM and PCOS with hirsutism. Discussion of progesterone for reduction of risk of endometrial hyperplasia.  Provera x 10 days now.  Detailed discussion of Kyleena in verbal and written form.  Risks and benefits reviewed.  HO given. I discussed intravaginal Cytotec and paracervical block for Kyleena insertion.  Follow up annually and prn.       After visit summary provided.

## 2016-10-04 ENCOUNTER — Encounter: Payer: Self-pay | Admitting: Obstetrics and Gynecology

## 2016-10-04 ENCOUNTER — Ambulatory Visit (INDEPENDENT_AMBULATORY_CARE_PROVIDER_SITE_OTHER): Payer: 59 | Admitting: Obstetrics and Gynecology

## 2016-10-04 VITALS — BP 116/70 | HR 68 | Resp 16 | Ht 65.25 in | Wt 244.0 lb

## 2016-10-04 DIAGNOSIS — Z3009 Encounter for other general counseling and advice on contraception: Secondary | ICD-10-CM | POA: Diagnosis not present

## 2016-10-04 DIAGNOSIS — E282 Polycystic ovarian syndrome: Secondary | ICD-10-CM

## 2016-10-04 DIAGNOSIS — L680 Hirsutism: Secondary | ICD-10-CM | POA: Diagnosis not present

## 2016-10-04 DIAGNOSIS — Z0000 Encounter for general adult medical examination without abnormal findings: Secondary | ICD-10-CM | POA: Diagnosis not present

## 2016-10-04 DIAGNOSIS — E119 Type 2 diabetes mellitus without complications: Secondary | ICD-10-CM | POA: Diagnosis not present

## 2016-10-04 DIAGNOSIS — N926 Irregular menstruation, unspecified: Secondary | ICD-10-CM | POA: Diagnosis not present

## 2016-10-04 DIAGNOSIS — Z01419 Encounter for gynecological examination (general) (routine) without abnormal findings: Secondary | ICD-10-CM

## 2016-10-04 LAB — POCT URINALYSIS DIPSTICK
Bilirubin, UA: NEGATIVE
Glucose, UA: NEGATIVE
Ketones, UA: NEGATIVE
Leukocytes, UA: NEGATIVE
Nitrite, UA: NEGATIVE
PH UA: 5
PROTEIN UA: NEGATIVE
RBC UA: 1
UROBILINOGEN UA: NEGATIVE

## 2016-10-04 LAB — POCT URINE PREGNANCY: PREG TEST UR: NEGATIVE

## 2016-10-04 MED ORDER — MEDROXYPROGESTERONE ACETATE 10 MG PO TABS: 10.0000 mg | ORAL_TABLET | Freq: Every day | ORAL | 0 refills | Status: DC

## 2016-10-04 NOTE — Patient Instructions (Signed)

## 2016-10-25 ENCOUNTER — Telehealth: Payer: Self-pay

## 2016-10-25 ENCOUNTER — Encounter: Payer: Self-pay | Admitting: Obstetrics and Gynecology

## 2016-10-25 NOTE — Telephone Encounter (Signed)
Visit Follow-Up Question  Message [id number]  From Netta NeatMeghan Knisely To Pamela SallesBrook E Amundson C Silva, MD Sent 10/25/2016 1:31 PM  Hi there - I took the Provera as instructed starting on the 9th and ending on the 18th. I had some light bleeding around 23rd-25th but then nothing...it's been 12 days since my last pill and just wanted to follow up to make sure this is ok.   I also think for now, I'm going to hold off on the IUD. I would like to avoid hyperplasia and would be ok taking something short term if my cycle doesn't come on it's own every month but I'd rather not take something every day or have something inserted into me. Thank you for taking the time to explain it to me and go through the options.    I hope your trip went very well!   Thanks,  Pamela Eaton   Responsible Party   Pool - [facility] Clinical Pool No one has taken responsibility for this message.  No actions have been taken on this message.   Routing to Dr.Silva for review. Okay to advise patient that her bleeding pattern following taking Provera is normal and to return call to the office if she goes three consecutive months without a menses?

## 2016-10-25 NOTE — Telephone Encounter (Signed)
Telephone encounter created to review MyChart message with Dr.Silva.

## 2016-10-26 ENCOUNTER — Encounter: Payer: Self-pay | Admitting: Obstetrics and Gynecology

## 2016-10-26 MED ORDER — MEDROXYPROGESTERONE ACETATE 10 MG PO TABS: 10.0000 mg | ORAL_TABLET | Freq: Every day | ORAL | 5 refills | Status: DC

## 2016-10-26 NOTE — Telephone Encounter (Signed)
I would recommend Provera 10 mg po x 10 days every other month if she is not cycling on her own.  Ok for a refill of the Provera x 6 months.  Always do a pregnancy test prior to starting the Provera.  Thanks.

## 2016-10-26 NOTE — Telephone Encounter (Signed)
Patient returned call

## 2016-10-26 NOTE — Telephone Encounter (Signed)
Spoke with patient. Please see telephone encounter dated 10/25/2016.

## 2016-10-26 NOTE — Telephone Encounter (Signed)
Left message to call Kaitlyn at [phone]. 

## 2016-10-26 NOTE — Telephone Encounter (Signed)
Spoke with patient. Patient states she started her menses this morning. Reports bleeding is normal. Advised of message as seen below from Dr.Silva. Patient is aware she will not need to take Provera this month as menses started. Will monitor her cycles and take Provera every other month if she is not cycling on her own. Aware she will need to take UPT prior to taking Provera. Rx for Provera 10 mg po 10 days every other month if not cycling #10 5RF sent to pharmacy on file. Patient is agreeable.  Visit Follow-Up Question  Message [id number]  From Netta NeatMeghan Dula To Patton SallesBrook E Amundson C Silva, MD Sent 10/26/2016 9:00 AM  Hi,   I had sent an email yesterday afternoon and just wanted to retract my comment about not bleeding. It started today.   Have a great weekend.   Thanks,  Kellee   Responsible Party   Pool - [facility] Pool No one has taken responsibility for this message.  No actions have been taken on this message.   Routing to provider for final review. Patient agreeable to disposition. Will close encounter.

## 2016-11-27 ENCOUNTER — Telehealth: Payer: Self-pay

## 2016-11-27 ENCOUNTER — Encounter: Payer: Self-pay | Admitting: Obstetrics and Gynecology

## 2016-11-27 NOTE — Telephone Encounter (Signed)
Non-Urgent Medical Question  Message [phone]  From Pamela NeatMeghan Eaton To Pamela SallesBrook E Amundson C Silva, MD Sent 11/27/2016 6:28 AM  Hi,   Last year I was under a significant amount of stress and was having trouble sleeping - at that time Dr. Edward JollySilva had said there were things we could do to help with that.Marland Kitchen.Marland Kitchen.Unfortunately things are going to be very straining in the near future and I'm worried about my sleep and stress levels. I received news last night related to laying people off, I still have a job, and couldn't get a wink of sleep. My mind is on over drive.   I hope everyone's holidays went really well. Any help would be appreciated.   Thanks,  [name]   Responsible Party   Pool - [facility] Pool No one has taken responsibility for this message.  No actions have been taken on this message.   Routing to covering provider for review as Dr.Silva is out of the office today.  Cc: Dr.Silva

## 2016-11-27 NOTE — Telephone Encounter (Signed)
Dr Edward JollySilva will be here in the morning, will defer to her since she knows the patient.

## 2016-11-27 NOTE — Telephone Encounter (Signed)
MyChart message sent to patient as seen below. Routing to Dr.Silva for review.  [name],  I have reviewed your message with the covering provider who would like Dr.Silva to be able to review this first thing in the morning to make recommendations as she knows your health history well. I will be in contact with you tomorrow to go over Dr.Silva's recommendations.  Sincerely, Michele McalpineKaitlyn Hines, RN

## 2016-11-28 ENCOUNTER — Ambulatory Visit (INDEPENDENT_AMBULATORY_CARE_PROVIDER_SITE_OTHER): Payer: 59 | Admitting: Obstetrics and Gynecology

## 2016-11-28 ENCOUNTER — Encounter: Payer: Self-pay | Admitting: Obstetrics and Gynecology

## 2016-11-28 VITALS — BP 122/77 | HR 90 | Ht 65.25 in

## 2016-11-28 DIAGNOSIS — G4700 Insomnia, unspecified: Secondary | ICD-10-CM | POA: Diagnosis not present

## 2016-11-28 DIAGNOSIS — F411 Generalized anxiety disorder: Secondary | ICD-10-CM

## 2016-11-28 DIAGNOSIS — F439 Reaction to severe stress, unspecified: Secondary | ICD-10-CM | POA: Diagnosis not present

## 2016-11-28 MED ORDER — PAROXETINE HCL 10 MG PO TABS: 10.0000 mg | ORAL_TABLET | ORAL | 1 refills | Status: DC

## 2016-11-28 MED ORDER — ZOLPIDEM TARTRATE 5 MG PO TABS
5.0000 mg | ORAL_TABLET | Freq: Every evening | ORAL | 0 refills | Status: DC | PRN
Start: 2016-11-28 — End: 2018-07-30

## 2016-11-28 NOTE — Telephone Encounter (Signed)
Please have patient come in to see me and we can discuss medication and give her an appropriate prescription.

## 2016-11-28 NOTE — Progress Notes (Signed)
GYNECOLOGY  VISIT   HPI: 31 y.o.   Married  Caucasian  female   G0P0 with Patient's last menstrual period was 10/25/2016 (approximate).   here for consultation regarding insomnia and anxiety.  Has habits when she is anxious and nervous.  States she has a tendency for obsession/compulsion. Not depressed. Denies suicidal ideation.  Difficulty relaxing to sleep.  Slept well last hs - 7 hours. Night before was 3 hours.  Has been averaging 4 - 5 hours.   Took sleeping aide in HS due to insomnia.  Has used xanax in the past for anxiety.  Thinks she took an antidepressant in the past for a short period of time.   Has had counseling for OCD in the past.   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  GYNECOLOGIC HISTORY: Patient's last menstrual period was 10/25/2016 (approximate). Contraception:  None Menopausal hormone therapy:  none Last mammogram:  n/a Last pap smear:   07-21-15 Neg:Neg HR HPV        OB History    Gravida Para Term Preterm AB Living   0             SAB TAB Ectopic Multiple Live Births                     Patient Active Problem List   Diagnosis Date Noted  . Cholecystitis with cholelithiasis 03/21/2013  . PCOS (polycystic ovarian syndrome) 03/21/2013  . Type 2 diabetes mellitus (HCC) 03/21/2013    Past Medical History:  Diagnosis Date  . Diabetes mellitus without complication (HCC)   . IBS (irritable bowel syndrome)   . Infertility, female   . Polycystic ovarian disease   . Umbilical hernia     Past Surgical History:  Procedure Laterality Date  . CHOLECYSTECTOMY N/A 03/21/2013   Procedure: LAPAROSCOPIC CHOLECYSTECTOMY WITH INTRAOPERATIVE CHOLANGIOGRAM;  Surgeon: Axel FillerArmando Ramirez, MD;  Location: [facility] OR;  Service: General;  Laterality: N/A;  . TONSILLECTOMY      Current Outpatient Prescriptions  Medication Sig Dispense Refill  . HUMALOG KWIKPEN 100 UNIT/ML  KiwkPen Inject 100 Units into the skin as needed. Takes on a sliding scale    . ibuprofen (ADVIL,MOTRIN) 800 MG tablet Take 1 tablet by mouth as needed.  0  . Insulin Glargine (BASAGLAR KWIKPEN) 100 UNIT/ML SOPN Inject 100 Units into the skin daily.    . metFORMIN (GLUCOPHAGE-XR) 500 MG 24 hr tablet Take 500 mg by mouth. Takes 1000mg  at night    . NOVOLOG FLEXPEN 100 UNIT/ML FlexPen Takes with each meal Takes 10-15 units  6  . OMEPRAZOLE PO Take 40 mg by mouth daily.    Letta Pate. ONETOUCH VERIO test strip     . medroxyPROGESTERone (PROVERA) 10 MG tablet Take 1 tablet (10 mg total) by mouth daily. Every other month if menses has not started. (Patient not taking: Reported on 11/28/2016) 10 tablet 5   No current facility-administered medications for this visit.      ALLERGIES: Glipizide  Family History  Problem Relation Age of Onset  . Asthma Mother   . Thyroid disease Mother   . Diabetes Father   . Migraines Sister   . Seizures Sister   . Rheum arthritis Sister     Social History   Social History  . Marital status: Married    Spouse name: N/A  . Number of children: N/A  . Years  of education: N/A   Occupational History  . Not on file.   Social History Main Topics  . Smoking status: Light Tobacco Smoker    Packs/day: 0.00    Types: Cigarettes    Last attempt to quit: 09/09/2016  . Smokeless tobacco: Never Used  . Alcohol use No  . Drug use: No  . Sexual activity: Yes    Partners: Male    Birth control/ protection: None   Other Topics Concern  . Not on file   Social History Narrative  . No narrative on file    ROS:  Pertinent items are noted in HPI.  PHYSICAL EXAMINATION:    BP 122/77 (BP Location: Right Arm, Patient Position: Sitting, Cuff Size: Large)   Pulse 90   Ht 5' 5.25" (1.657 m)   LMP 10/25/2016 (Approximate)     General appearance: alert, cooperative and appears stated age.  Quiet mood.  ASSESSMENT  Situational stress and anxiety.  Tendency toward  obsession and compulsion. Insomnia.  PLAN  We discussed multifaceted approach to dealing with anxiety.  SSRIs discussed.  Will start Paxil 10 mg daily.  Risks, benefits and side effects reviewed. Serotonin syndrome discussed. Also gave Rx for Ambien 5 mg q hs prn.  #30, RF none.  I am recommending increased exercise. I have also referred her to [facility] psychology group for counseling as she is dealing with a very specific event that is very stressful.  Follow up for a recheck in 6 weeks, sooner as needed.  An After Visit Summary was printed and given to the patient.  ___25___ minutes face to face time of which over 50% was spent in counseling.

## 2016-11-28 NOTE — Telephone Encounter (Signed)
Spoke with patient. Advised of message as seen below from Dr.Silva. Patient verbalize understanding. Appointment scheduled for today 11/28/2016 at 3:30 pm. Patient is agreeable to date and time.  Routing to provider for final review. Patient agreeable to disposition. Will close encounter.

## 2017-01-04 ENCOUNTER — Telehealth: Payer: Self-pay | Admitting: Obstetrics and Gynecology

## 2017-01-04 ENCOUNTER — Encounter: Payer: Self-pay | Admitting: Obstetrics and Gynecology

## 2017-01-04 NOTE — Telephone Encounter (Signed)
Please see telephone encounter dated with today's date. 

## 2017-01-04 NOTE — Telephone Encounter (Signed)
Spoke with patient. Patient states that she stopped taking Paxil because she misplaced it when on a work trip. Has been off the medication for 2 weeks and feels well. Does not desire to take Paxil at this time. Advised I will notify Dr.Silva and for her to return call with any questions or concerns.  Visit Follow-Up Question  Message [id number]  From Netta NeatMeghan Roker To Patton SallesBrook E Amundson C Silva, MD Sent 01/04/2017 2:02 PM  Hi, I received a message from Caitlyn (sp?) and I've tried to call back but there were two other callers in front of me. Please call me back at your convenience - [phone]. Thanks.   Responsible Party   Pool - [facility] Pool No one has taken responsibility for this message.  No actions have been taken on this message.   Dr.Silva, anything further for this patient?

## 2017-01-04 NOTE — Telephone Encounter (Signed)
Patient cancelled recheck appointment. Say she is doing fine and does not need to follow up.

## 2017-01-04 NOTE — Telephone Encounter (Signed)
Left message to call Danijela Vessey at [phone]. 

## 2017-01-04 NOTE — Telephone Encounter (Signed)
Please contact patient back.  Is she still taking the Paxil?

## 2017-01-06 NOTE — Telephone Encounter (Signed)
Thank you for the update.  Ok to close the encounter.  

## 2017-01-09 ENCOUNTER — Ambulatory Visit: Payer: 59 | Admitting: Obstetrics and Gynecology

## 2017-07-26 IMAGING — DX DG WRIST COMPLETE 3+V*L*
4 series · 4 of 4 positions shown · non-contrast
Comparison: 04/23/2015

CLINICAL DATA: Fall 1 week ago and injured left wrist. Pain near
navicular with overlying bruising.

EXAM:
LEFT WRIST - COMPLETE 3+ VIEW

[wrist pa]
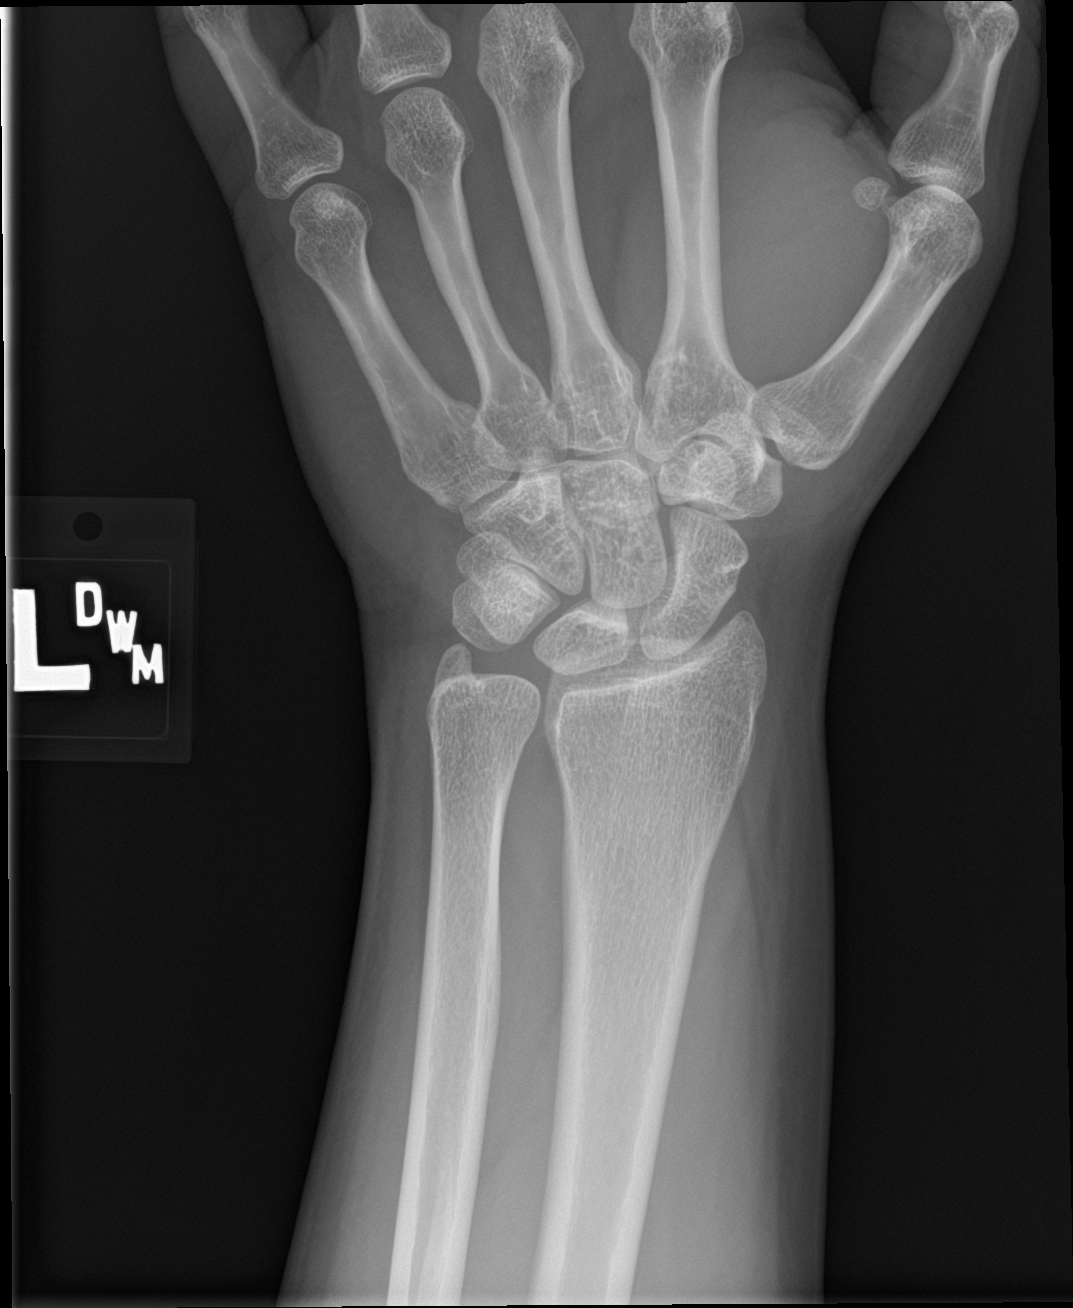

[wrist obl]
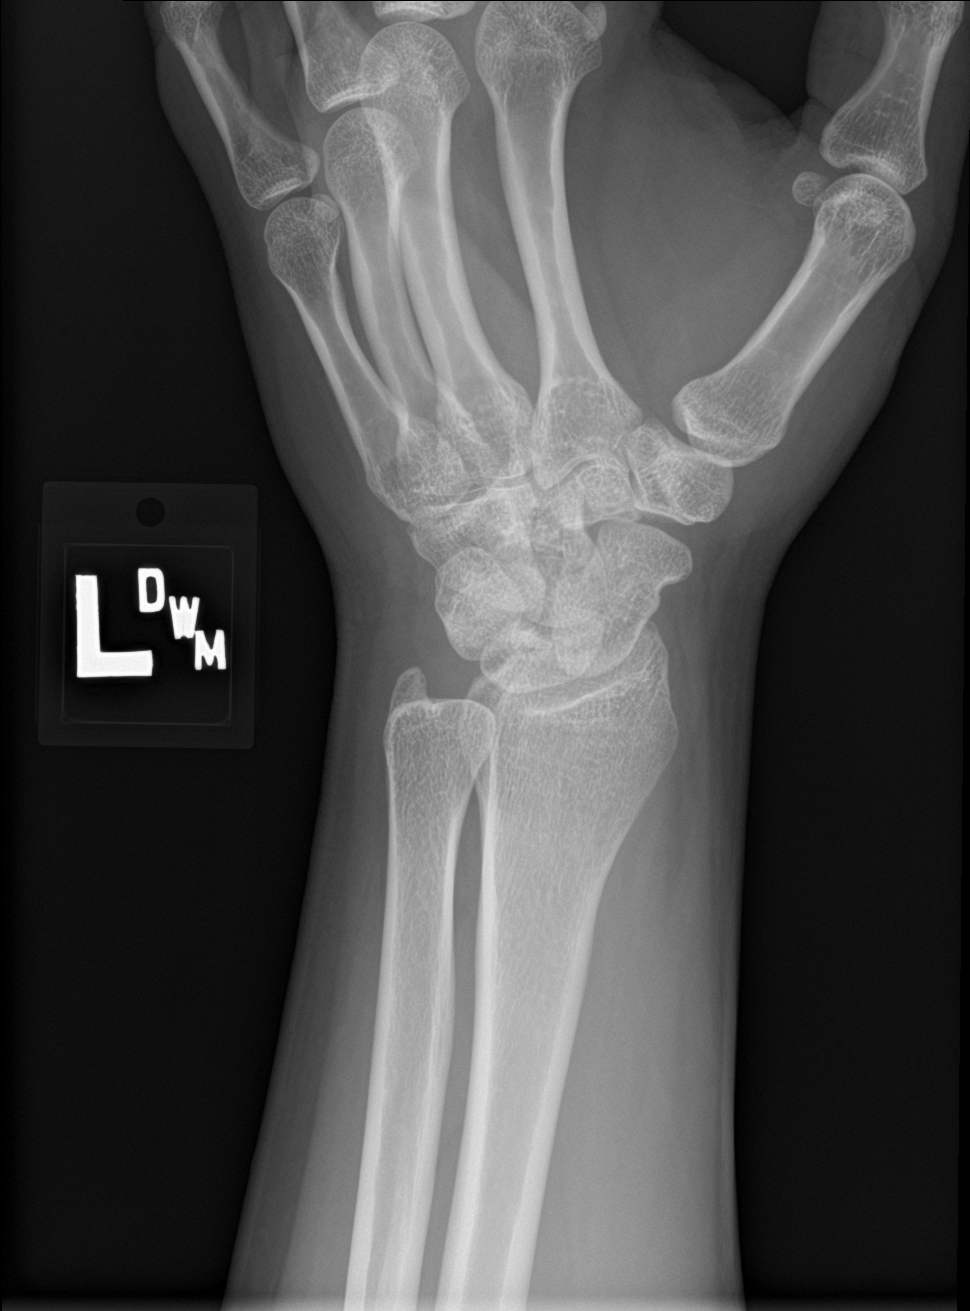

[wrist lat]
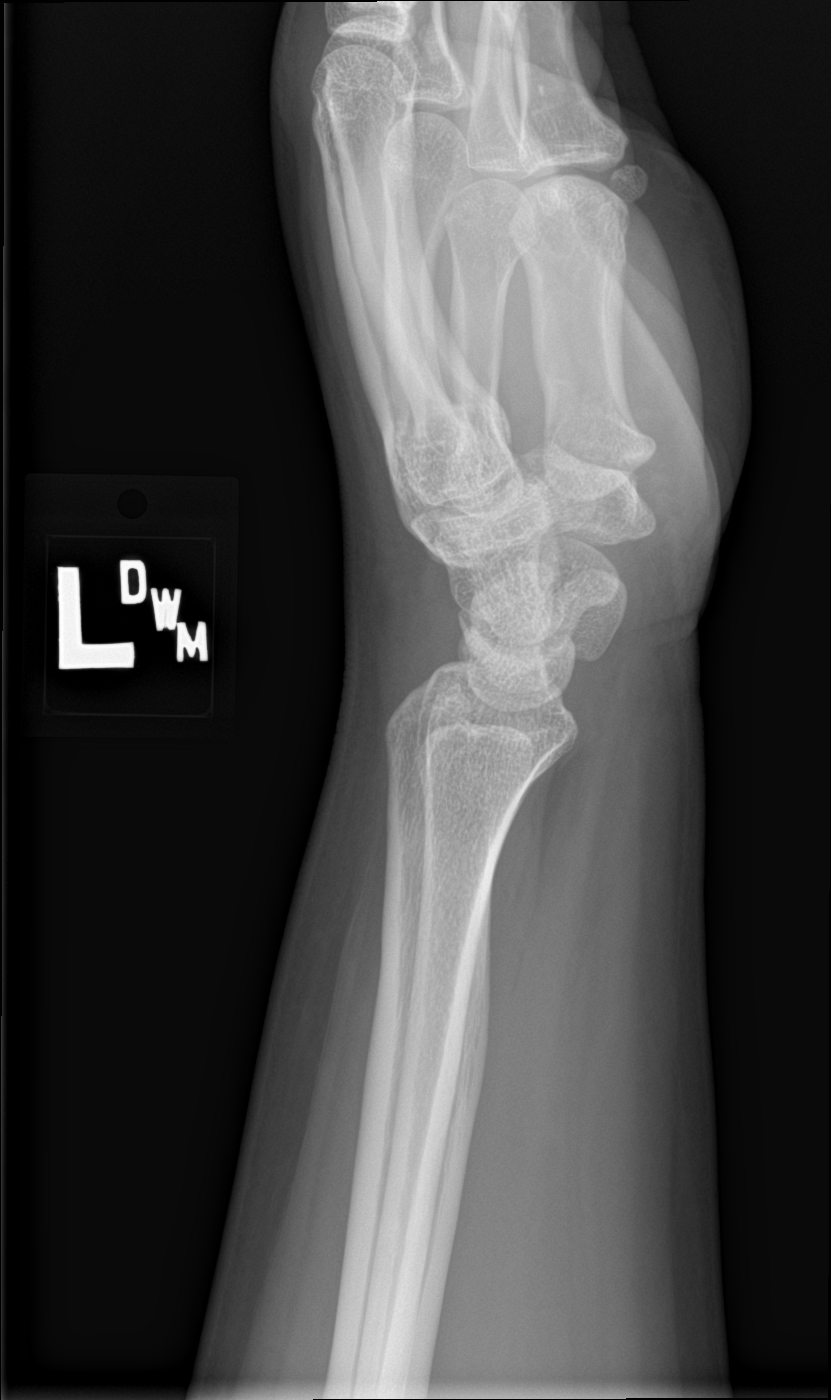

[wrist navicular]
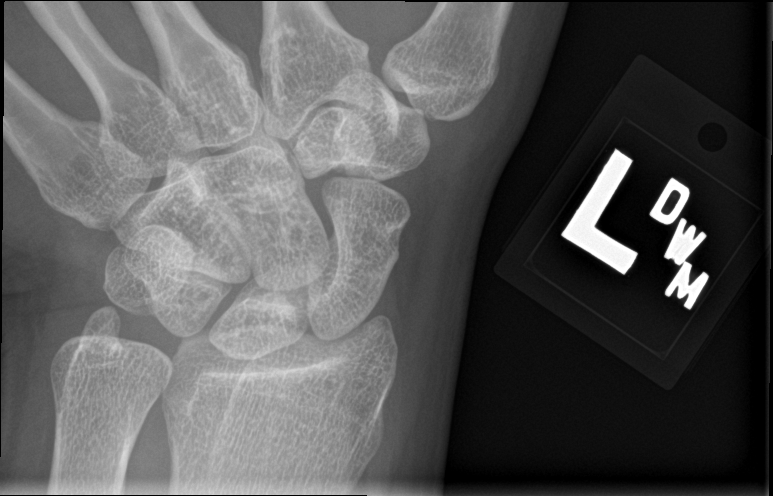

[4 of 4 positions shown; findings below may reference images not displayed]

FINDINGS: There is no evidence of fracture or dislocation. There is no
evidence of arthropathy or other focal bone abnormality. Soft
tissues are unremarkable.
IMPRESSION: Negative.

## 2017-10-11 ENCOUNTER — Ambulatory Visit: Payer: 59 | Admitting: Obstetrics and Gynecology

## 2017-10-29 ENCOUNTER — Ambulatory Visit: Payer: 59 | Admitting: Obstetrics and Gynecology

## 2018-07-28 ENCOUNTER — Encounter: Payer: Self-pay | Admitting: Obstetrics and Gynecology

## 2018-07-29 ENCOUNTER — Telehealth: Payer: Self-pay | Admitting: Obstetrics and Gynecology

## 2018-07-29 NOTE — Telephone Encounter (Signed)
Changed appointment to annual exam.  Encounter closed.

## 2018-07-29 NOTE — Telephone Encounter (Signed)
Patient sent the following correspondence through MyChart. Routing to triage to assist patient with request as Dr. Edward Jolly is not in the office today.  Appointment Request From: Pamela Eaton    With Provider: Melony Overly, MD [facility]]    Preferred Date Range: 07/28/2018 - 08/01/2018    Preferred Times: Any time    Reason for visit: Request an Appointment    Comments:  Had period regularly for 7 months. Late last month(8) and this month (9). If not period feels like maybe UTI or something, but I dont know... 14 days late, and I feel less than stellar, bloated, breast tenderness, etc...tests negative. Also past due for my exam and havent had an ultrasound in a while.

## 2018-07-29 NOTE — Telephone Encounter (Signed)
Please make this visit an annual exam appointment, and I will also address her menstrual cycles.

## 2018-07-29 NOTE — Telephone Encounter (Signed)
Spoke with patient.  Cycled on her own for last 7 months. Hx PCOS. Negative pregnancy test on Sunday 07/27/18.  No pelvic pain or abd pain. But feels like she may start a cycle.  Office visit with Dr. Edward Jolly scheduled for 07/30/18 at 1130. Patient agreeable and will call back with any concerns prior to office visit.   Routing to Dr. Edward Jolly to review and advise if okay for appointment tomorrow.

## 2018-07-30 ENCOUNTER — Ambulatory Visit (INDEPENDENT_AMBULATORY_CARE_PROVIDER_SITE_OTHER): Payer: BLUE CROSS/BLUE SHIELD | Admitting: Obstetrics and Gynecology

## 2018-07-30 ENCOUNTER — Other Ambulatory Visit (HOSPITAL_COMMUNITY)
Admission: RE | Admit: 2018-07-30 | Discharge: 2018-07-30 | Disposition: A | Discharge status: Home / Self Care | Payer: BLUE CROSS/BLUE SHIELD | Source: Ambulatory Visit | Attending: Obstetrics and Gynecology | Admitting: Obstetrics and Gynecology

## 2018-07-30 ENCOUNTER — Encounter: Payer: Self-pay | Admitting: Obstetrics and Gynecology

## 2018-07-30 ENCOUNTER — Other Ambulatory Visit: Payer: Self-pay

## 2018-07-30 VITALS — BP 110/76 | HR 80 | Resp 16 | Ht 65.25 in | Wt 233.0 lb

## 2018-07-30 DIAGNOSIS — N760 Acute vaginitis: Secondary | ICD-10-CM

## 2018-07-30 DIAGNOSIS — Z01411 Encounter for gynecological examination (general) (routine) with abnormal findings: Secondary | ICD-10-CM | POA: Diagnosis not present

## 2018-07-30 DIAGNOSIS — N926 Irregular menstruation, unspecified: Secondary | ICD-10-CM | POA: Diagnosis not present

## 2018-07-30 DIAGNOSIS — Z1151 Encounter for screening for human papillomavirus (HPV): Secondary | ICD-10-CM | POA: Insufficient documentation

## 2018-07-30 DIAGNOSIS — Z01419 Encounter for gynecological examination (general) (routine) without abnormal findings: Secondary | ICD-10-CM | POA: Diagnosis not present

## 2018-07-30 NOTE — Progress Notes (Signed)
32 y.o. G0P0 Married Caucasian female here for annual exam.  Patient complains of having irregular menses.    Last November started regular menses again.  Last 5 days.  July and August menses were 12 days late.  Was traveling then.  Now no menses, and is 17 days late.  Negative UPT this weekend.  Constipated, bloated, and feels like her menses are about to start.   Also feeling pain near the clitoris when she crosses her legs.  No change in her vaginal discharge.  Notes whitish and pink tissue in the toilet.  Not bloody.   Not taking fertility medication.  Wants to loose weight first.  Has a Systems analyst.   Dealing with frozen shoulder.   Started with a new PCP last month.  Has an appointment tomorrow.  Is back on insulin and increased her metformin.   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   UPT negative.   PCP:  Florene Glen, NP  Patient's last menstrual period was 06/07/2018.           Sexually active: Yes.    The current method of family planning is none.    Exercising: Yes.    gym Smoker:  no  Health Maintenance: Pap:  07/21/15 Neg:Neg HR HPV History of abnormal Pap:  Yes, 2012 but no treatment TDaP:  2016 Gardasil:   Did one injection and developed fever after her vaccine.  HIV and Hep C: never Screening Labs:  PCP   reports that she quit smoking about 5 weeks ago. Her smoking use included cigarettes. She smoked 0.00 packs per day. She has never used smokeless tobacco. She reports that she does not drink alcohol or use drugs.  Past Medical History:  Diagnosis Date  . Diabetes mellitus without complication (HCC)   . IBS (irritable bowel syndrome)   . Infertility, female   . Polycystic ovarian disease   . Umbilical hernia     Past Surgical History:  Procedure Laterality Date  . CHOLECYSTECTOMY N/A 03/21/2013   Procedure: LAPAROSCOPIC CHOLECYSTECTOMY WITH INTRAOPERATIVE CHOLANGIOGRAM;  Surgeon: Axel Filler, MD;  Location: [facility] OR;   Service: General;  Laterality: N/A;  . TONSILLECTOMY      Current Outpatient Medications  Medication Sig Dispense Refill  . ibuprofen (ADVIL,MOTRIN) 800 MG tablet Take 1 tablet by mouth as needed.  0  . Insulin Glargine (BASAGLAR KWIKPEN) 100 UNIT/ML SOPN Inject 100 Units into the skin daily.    . metFORMIN (GLUCOPHAGE-XR) 500 MG 24 hr tablet Take 500 mg by mouth. Takes 1000mg  at night    . OMEPRAZOLE PO Take 40 mg by mouth daily.    Letta Pate VERIO test strip      No current facility-administered medications for this visit.     Family History  Problem Relation Age of Onset  . Asthma Mother   . Thyroid disease Mother   . Diabetes Father   . Migraines Sister   . Seizures Sister   . Rheum arthritis Sister     Review of Systems  Constitutional:       Weight gain Breast tenderness  Gastrointestinal:       Bloating   Genitourinary:       Menstrual cycle changes  All other systems reviewed and are negative.   Exam:   BP 110/76 (BP Location: Right Arm, Patient Position: Sitting, Cuff Size: Normal)   Pulse 80   Resp 16   Ht 5' 5.25" (1.657 m)   Wt 233  lb (105.7 kg)   LMP 06/07/2018   BMI 38.48 kg/m     General appearance: alert, cooperative and appears stated age Head: Normocephalic, without obvious abnormality, atraumatic Neck: no adenopathy, supple, symmetrical, trachea midline and thyroid normal to inspection and palpation Lungs: clear to auscultation bilaterally Breasts: normal appearance, no masses or tenderness, No nipple retraction or dimpling, No nipple discharge or bleeding, No axillary or supraclavicular adenopathy Heart: regular rate and rhythm Abdomen: obese, soft, non-tender; no masses, no organomegaly Extremities: extremities normal, atraumatic, no cyanosis or edema Skin: Skin color, texture, turgor normal. No rashes or lesions.  Shaven face. Lymph nodes: Cervical, supraclavicular, and axillary nodes normal. No abnormal inguinal nodes  palpated Neurologic: Grossly normal  Pelvic: External genitalia:  no lesions              Urethra:  normal appearing urethra with no masses, tenderness or lesions              Bartholins and Skenes: normal                 Vagina: normal appearing vagina with normal color and discharge, no lesions              Cervix: no lesions              Pap taken: Yes.   Bimanual Exam:  Uterus:  normal size, contour, position, consistency, mobility, non-tender              Adnexa: no mass, fullness, tenderness  Chaperone was present for exam.  Assessment:   Well woman visit with normal exam. Periods of amenorrhea with irregular cycles. Vulvovaginitis.  PCOS with hirsutism. Hx echogenic focus 8 x 5 mm of left ovary.  DM.  Elevated triglycerides.  Obesity.  Hx infertility. Prior abnormal pap in 2012.  Plan: Mammogram screening age 66. Recommended self breast awareness. Pap and HR HPV as above. Guidelines for Calcium, Vitamin D, regular exercise program including cardiovascular and weight bearing exercise. Will do hCG. If hCG negative, plan for Provera 10 mg x 10 days.  If abnormal bleeding continues, will need pelvic ultrasound and EMB.  I mentioned weight loss surgery as an option for care, and the patient prefers to do her weight loss plan without this.  Follow up annually and prn.   After visit summary provided.

## 2018-07-30 NOTE — Patient Instructions (Signed)

## 2018-07-31 LAB — BETA HCG QUANT (REF LAB): hCG Quant: <1 m[IU]/mL

## 2018-08-01 LAB — CYTOLOGY - PAP
BACTERIAL VAGINITIS: POSITIVE — AB
Candida vaginitis: NEGATIVE
DIAGNOSIS: NEGATIVE
HPV: NOT DETECTED
TRICH (WINDOWPATH): NEGATIVE

## 2018-08-01 LAB — POCT URINE PREGNANCY: Preg Test, Ur: NEGATIVE

## 2018-08-01 MED ORDER — MEDROXYPROGESTERONE ACETATE 10 MG PO TABS: 10.0000 mg | ORAL_TABLET | Freq: Every day | ORAL | 0 refills | Status: DC

## 2018-08-04 ENCOUNTER — Other Ambulatory Visit: Payer: Self-pay | Admitting: *Deleted

## 2018-08-04 MED ORDER — METRONIDAZOLE 500 MG PO TABS: 500.0000 mg | ORAL_TABLET | Freq: Two times a day (BID) | ORAL | 0 refills | Status: DC

## 2018-11-20 ENCOUNTER — Encounter: Payer: Self-pay | Admitting: Obstetrics and Gynecology

## 2018-11-20 DIAGNOSIS — Z3201 Encounter for pregnancy test, result positive: Secondary | ICD-10-CM

## 2018-11-20 DIAGNOSIS — E1165 Type 2 diabetes mellitus with hyperglycemia: Secondary | ICD-10-CM

## 2018-11-20 DIAGNOSIS — Z794 Long term (current) use of insulin: Secondary | ICD-10-CM

## 2018-11-20 DIAGNOSIS — R7309 Other abnormal glucose: Secondary | ICD-10-CM

## 2018-11-20 DIAGNOSIS — N912 Amenorrhea, unspecified: Secondary | ICD-10-CM

## 2018-11-20 DIAGNOSIS — E282 Polycystic ovarian syndrome: Secondary | ICD-10-CM

## 2018-11-20 NOTE — Telephone Encounter (Signed)
Call to patient.  She has taken 3 positive pregnancy tests today.  LMP 10/10/18.  [id number].  No pain or bleeding. Feelijng well per patient.  Hemoglobin A1C at [facility] with PCP was 8.2  Patient does not currently have an OB provider in mind.  [facility] to call.  Referral placed to Maternal Fetal Medicine for processing while awaiting confirmation of pregnancy.  Patient agreeable to plan. Office visit tomorrow with Dr. Edward JollySilva for pregnancy confirmation.  .Marland Kitchen

## 2018-11-21 ENCOUNTER — Other Ambulatory Visit: Payer: Self-pay

## 2018-11-21 ENCOUNTER — Ambulatory Visit (INDEPENDENT_AMBULATORY_CARE_PROVIDER_SITE_OTHER): Payer: BLUE CROSS/BLUE SHIELD | Admitting: Obstetrics and Gynecology

## 2018-11-21 ENCOUNTER — Encounter (HOSPITAL_COMMUNITY): Payer: Self-pay

## 2018-11-21 ENCOUNTER — Encounter: Payer: Self-pay | Admitting: Obstetrics and Gynecology

## 2018-11-21 VITALS — BP 122/80 | HR 100 | Ht 65.25 in | Wt 235.6 lb

## 2018-11-21 DIAGNOSIS — N926 Irregular menstruation, unspecified: Secondary | ICD-10-CM

## 2018-11-21 LAB — POCT URINE PREGNANCY: Preg Test, Ur: POSITIVE — AB

## 2018-11-21 NOTE — Progress Notes (Signed)
GYNECOLOGY  VISIT   HPI: 32 y.o.   Married  Caucasian  female   G0P0 with Patient's last menstrual period was 10/10/2018 (exact date).   here for pregnancy confirmation.   Menses every 32 days.   Feels breast tenderness, fatigue, and urinary frequency.   Positive UPT at home yesterday and today.  No pelvic pain.  A1C 8.2 last week.  [facility] [place].  UPT: Faintly Positive  GYNECOLOGIC HISTORY: Patient's last menstrual period was 10/10/2018 (exact date). Contraception: None Menopausal hormone therapy:  n/a Last mammogram:  n/a Last pap smear:  07-30-18 Neg:Neg HR HPV                              05-24-14 Neg:Neg HR HPV OB History    Gravida  0   Para      Term      Preterm      AB      Living        SAB      TAB      Ectopic      Multiple      Live Births                 Patient Active Problem List   Diagnosis Date Noted  . Cholecystitis with cholelithiasis 03/21/2013  . PCOS (polycystic ovarian syndrome) 03/21/2013  . Type 2 diabetes mellitus (HCC) 03/21/2013    Past Medical History:  Diagnosis Date  . Diabetes mellitus without complication (HCC)   . IBS (irritable bowel syndrome)   . Infertility, female   . Polycystic ovarian disease   . Umbilical hernia     Past Surgical History:  Procedure Laterality Date  . CHOLECYSTECTOMY N/A 03/21/2013   Procedure: LAPAROSCOPIC CHOLECYSTECTOMY WITH INTRAOPERATIVE CHOLANGIOGRAM;  Surgeon: Axel FillerArmando Ramirez, MD;  Location: [facility] OR;  Service: General;  Laterality: N/A;  . TONSILLECTOMY      Current Outpatient Medications  Medication Sig Dispense Refill  . Insulin Glargine (BASAGLAR KWIKPEN) 100 UNIT/ML SOPN Inject 30 Units into the skin daily.     . Insulin Pen Needle 31G X 5 MM MISC Use as directed with insulin pen    . metFORMIN (GLUCOPHAGE-XR) 500 MG 24 hr tablet Take 500 mg by mouth. Takes 1000mg  at night    . omeprazole (PRILOSEC) 20 MG capsule Take 1 capsule by mouth  daily.    Letta Pate. ONETOUCH VERIO test strip      No current facility-administered medications for this visit.      ALLERGIES: Glipizide  Family History  Problem Relation Age of Onset  . Asthma Mother   . Thyroid disease Mother   . Diabetes Father   . Migraines Sister   . Seizures Sister   . Rheum arthritis Sister     Social History   Socioeconomic History  . Marital status: Married    Spouse name: Not on file  . Number of children: Not on file  . Years of education: Not on file  . Highest education level: Not on file  Occupational History  . Not on file  Social Needs  . Financial resource strain: Not on file  . Food insecurity:    Worry: Not on file    Inability: Not on file  . Transportation needs:    Medical: Not on file    Non-medical: Not on file  Tobacco Use  . Smoking status: Former Smoker  Packs/day: 0.00    Types: Cigarettes    Last attempt to quit: 06/25/2018    Years since quitting: 0.4  . Smokeless tobacco: Never Used  Substance and Sexual Activity  . Alcohol use: No    Alcohol/week: 0.0 standard drinks  . Drug use: No  . Sexual activity: Yes    Partners: Male    Birth control/protection: None  Lifestyle  . Physical activity:    Days per week: Not on file    Minutes per session: Not on file  . Stress: Not on file  Relationships  . Social connections:    Talks on phone: Not on file    Gets together: Not on file    Attends religious service: Not on file    Active member of club or organization: Not on file    Attends meetings of clubs or organizations: Not on file    Relationship status: Not on file  . Intimate partner violence:    Fear of current or ex partner: Not on file    Emotionally abused: Not on file    Physically abused: Not on file    Forced sexual activity: Not on file  Other Topics Concern  . Not on file  Social History Narrative  . Not on file    Review of Systems  All other systems reviewed and are negative.   PHYSICAL  EXAMINATION:    BP 122/80 (BP Location: Right Arm, Patient Position: Sitting, Cuff Size: Normal)   Pulse 100   Ht 5' 5.25" (1.657 m)   Wt 235 lb 9.6 oz (106.9 kg)   LMP 10/10/2018 (Exact Date)   BMI 38.91 kg/m     General appearance: alert, cooperative and appears stated age   Pelvic: External genitalia:  no lesions              Urethra:  normal appearing urethra with no masses, tenderness or lesions              Bartholins and Skenes: normal                 Vagina: normal appearing vagina with normal color and discharge, no lesions              Cervix: no lesions                Bimanual Exam:  Uterus:  normal size, contour, position, consistency, mobility, non-tender              Adnexa: no mass, fullness, tenderness            Chaperone was present for exam.  ASSESSMENT  Early pregnancy.  Hx infertility.  Elevated A1C.  PLAN  PNV.  Avoid exposures. Flu vaccine recommended.  HCG today and in 3 days (due to the weekend). [facility]. List of OB providers to patient.   An After Visit Summary was printed and given to the patient.  __15____ minutes face to face time of which over 50% was spent in counseling.

## 2018-11-22 LAB — BETA HCG QUANT (REF LAB): hCG Quant: 633 m[IU]/mL

## 2018-11-24 ENCOUNTER — Other Ambulatory Visit: Payer: Self-pay | Admitting: *Deleted

## 2018-11-24 ENCOUNTER — Ambulatory Visit (INDEPENDENT_AMBULATORY_CARE_PROVIDER_SITE_OTHER): Payer: BLUE CROSS/BLUE SHIELD

## 2018-11-24 ENCOUNTER — Telehealth: Payer: Self-pay | Admitting: Obstetrics and Gynecology

## 2018-11-24 ENCOUNTER — Encounter: Payer: Self-pay | Admitting: Obstetrics and Gynecology

## 2018-11-24 DIAGNOSIS — Z3201 Encounter for pregnancy test, result positive: Secondary | ICD-10-CM

## 2018-11-24 DIAGNOSIS — N926 Irregular menstruation, unspecified: Secondary | ICD-10-CM

## 2018-11-24 NOTE — Telephone Encounter (Signed)
Good Morning,    So sorry to be a bother, but could you please let me know when I should expect to receive the results of the second blood test that we did this morning? I'm a little anxious, as usual haha, and if I knew when I could expect results it may help calm my nerves.    Thanks very much and I hope you had a nice weekend!    Best,  DynegyMeghan

## 2018-11-25 ENCOUNTER — Ambulatory Visit (HOSPITAL_COMMUNITY)
Admission: RE | Admit: 2018-11-25 | Discharge: 2018-11-25 | Disposition: A | Discharge status: Home / Self Care | Payer: BLUE CROSS/BLUE SHIELD | Source: Ambulatory Visit | Attending: Obstetrics and Gynecology | Admitting: Obstetrics and Gynecology

## 2018-11-25 ENCOUNTER — Encounter (HOSPITAL_COMMUNITY): Payer: Self-pay

## 2018-11-25 DIAGNOSIS — O24911 Unspecified diabetes mellitus in pregnancy, first trimester: Secondary | ICD-10-CM | POA: Diagnosis not present

## 2018-11-25 DIAGNOSIS — O24111 Pre-existing diabetes mellitus, type 2, in pregnancy, first trimester: Secondary | ICD-10-CM | POA: Insufficient documentation

## 2018-11-25 DIAGNOSIS — O09511 Supervision of elderly primigravida, first trimester: Secondary | ICD-10-CM | POA: Diagnosis not present

## 2018-11-25 DIAGNOSIS — E119 Type 2 diabetes mellitus without complications: Secondary | ICD-10-CM | POA: Insufficient documentation

## 2018-11-25 DIAGNOSIS — Z87891 Personal history of nicotine dependence: Secondary | ICD-10-CM | POA: Diagnosis not present

## 2018-11-25 DIAGNOSIS — Z794 Long term (current) use of insulin: Secondary | ICD-10-CM | POA: Insufficient documentation

## 2018-11-25 DIAGNOSIS — Z3A01 Less than 8 weeks gestation of pregnancy: Secondary | ICD-10-CM | POA: Insufficient documentation

## 2018-11-25 HISTORY — DX: Palpitations: R00.2

## 2018-11-25 LAB — BETA HCG QUANT (REF LAB): HCG QUANT: 1399 m[IU]/mL

## 2018-11-25 NOTE — Consult Note (Signed)
Maternal-Fetal Medicine Name: Pamela Eaton  MRN: [id number] Requesting Provider: Conley SimmondsBrook Silva, MD  I had the pleasure of seeing [name] today at [facility]. She is a G1 P0 at very early pregnancy (positive serum beta hCG) and is here for consultation because of type 2 diabetes.  Patient reports she has type 2 diabetes that was diagnosed several years ago. She has been on insulin (different regimens) over the years. Currently, she is being followed by Ms. Carley HammedMarissa Hite, NP. Patient takes basaglar (glargine) insulin 45 units in the morning. She is checking her blood glucose regularly and reports her fasting levels are between 180 and 200 mg/dL and postprandial (1-hour) in the 200s. Patient does not have neuropathy. Her recent ophthalmology exam (less than 1 year) is reportedly normal. Her most-recent (last week) hemoglobin A1C is 8.2%. ROS: No severe headache or visual disturbances or thyromegaly. No chest pain or palpitations or vomiting. She has occasional GI disturbances (bloating and diarrhea) because of metformin. No joint pain or abdominal pain. No recurrent urinary tract infections.  No history of hypertension or any other chronic medical conditions.  PSH: Laparoscopic cholecystectomy, tonsillectomy. Gyn history: Previous cycles regular (32 days) and LMP was 10/10/18. No history of abnormal Pap smear or cervical surgeries.  Medications: Insulin, prenatal vitamins. Allergies: NKDA. She reports she is NOT allergic to glipizide (had hypoglycemia). Social: Denies tobacco or drug or alcohol use. She used to smoke  to 1 PPD before pregnancy. She has been married 13 years and her husband (Caucasian) is in good health. Family: No history of venous thromboembolism. Father has diabetes.   Labs: Serum beta hCG 1,399 (11/24/18).  I counseled the patient on the following:  Pregestational diabetes: Based on beta hCG values, she is in her very early pregnancy and I  recommend dating ultrasound in 2 weeks. I explained the normal parameters of blood glucose levels and encouraged her to regularly check her blood glucose levels. I emphasized the importance of strict blood glucose control to prevent fetal and neonatal adverse outcomes. I recommended that she makes an appointment with an endocrinologist. Her blood glucose levels reflect poor control and she would require short-acting insulin with meals in addition to basaglar or NPH.  I discussed the importance of good control of diabetes to prevent adverse fetal and neonatal outcomes. Miscarriages are increased. The likelihood of congenital malformations with a periconception hemoglobin A1C of 8.2% is about 5% (as opposed to a background risk of 2% to 3%). Fetal congenital malformations are slightly increased even if diabetes is well controlled. Miscarriages are more common in poorly-controlled diabetes.  Other complications include fetal macrosomia leading shoulder dystocia or neurological injuries at birth (Erb's palsy). Neonatal complications including respiratory-distress syndrome, hypoglycemia can also occur. In poorly-controlled diabetes, the likelihood of stillbirth is increased.  I discussed management that includes diet, exercise and insulin. Metformin can be continued with insulin.  I discussed ultrasound protocol. Serial fetal growth assessments and weekly antenatal testing beginning at 32 weeks till delivery will be performed.  Delivery is recommended at 39 weeks if diabetes is well controlled. Early term delivery (37 to 39 weeks) may be recommended if poor control is seen.  I discussed the benefit of low-dose aspirin in reducing the likelihood of developing preeclampsia. I recommended aspirin 81 mg to be taken daily from 13 weeks' gestation until 39 weeks. Patient does not have contraindications to aspirin.  Advanced maternal age:  I informed that AMA is associated with an increase in fetal  aneuploidies.  I discussed available screening methods. I also discussed cell-free fetal DNA screening that analyzes fetal DNA in maternal blood for trisomies 21, 18 and 13, and, which has a greater detection rate than conventional screening tests. I informed the patient that not all chromosomal malformations are detected by this test. I informed her that only invasive tests including chorion villus sampling or amniocentesis will give a definitive result on the fetal karyotype. I informed her that pregnancy loss from the procedure is about 1 in 400.  Recommendations: -Initiate consultation with endocrinologist ASAP for better control of diabetes. -Patient has an appointment with her obstetrician [facility]). -Dating ultrasound in 2 weeks. -Offer cell-free fetal DNA screening and discuss CVS/amnio. -Fetal anatomy scan at 20 weeks. -Fetal echocardiography by pediatric cardiologist at 20 to 22 weeks' gestation. -Serial fetal growth assessments every 4 weeks from 24 weeks. -Weekly antenatal testing from 32 weeks till delivery. -Consider delivery at 39 weeks. -Low-dose aspirin (81 mg/day) from 12 weeks till delivery.   Thank you for your consult. Please do not hesitate to contact me if you have any questions or concerns.  Consultation including face-to-face counseling: 40 min.

## 2018-12-16 LAB — OB RESULTS CONSOLE ABO/RH: RH Type: POSITIVE

## 2018-12-16 LAB — OB RESULTS CONSOLE HIV ANTIBODY (ROUTINE TESTING): HIV: NONREACTIVE

## 2018-12-16 LAB — OB RESULTS CONSOLE RUBELLA ANTIBODY, IGM: Rubella: IMMUNE

## 2018-12-16 LAB — OB RESULTS CONSOLE HEPATITIS B SURFACE ANTIGEN: Hepatitis B Surface Ag: NEGATIVE

## 2018-12-16 LAB — OB RESULTS CONSOLE ANTIBODY SCREEN: Antibody Screen: NEGATIVE

## 2018-12-16 LAB — OB RESULTS CONSOLE RPR: RPR: NONREACTIVE

## 2018-12-16 LAB — OB RESULTS CONSOLE GC/CHLAMYDIA
Chlamydia: NEGATIVE
Gonorrhea: NEGATIVE

## 2019-07-08 ENCOUNTER — Encounter (HOSPITAL_COMMUNITY): Payer: Self-pay | Admitting: *Deleted

## 2019-07-08 ENCOUNTER — Telehealth (HOSPITAL_COMMUNITY): Payer: Self-pay | Admitting: *Deleted

## 2019-07-08 NOTE — Telephone Encounter (Signed)
Preadmission screen  

## 2019-07-18 ENCOUNTER — Other Ambulatory Visit (HOSPITAL_COMMUNITY)
Admission: RE | Admit: 2019-07-18 | Discharge: 2019-07-18 | Disposition: A | Discharge status: Home / Self Care | Payer: BC Managed Care – PPO | Source: Ambulatory Visit | Attending: Obstetrics and Gynecology | Admitting: Obstetrics and Gynecology

## 2019-07-18 ENCOUNTER — Other Ambulatory Visit: Payer: Self-pay

## 2019-07-18 DIAGNOSIS — Z20828 Contact with and (suspected) exposure to other viral communicable diseases: Secondary | ICD-10-CM | POA: Diagnosis present

## 2019-07-18 LAB — SARS CORONAVIRUS 2 (TAT 6-24 HRS): SARS Coronavirus 2: NEGATIVE

## 2019-07-18 NOTE — MAU Note (Signed)
Pt here for PAT covid swab, denies any symptoms. Swab collected 

## 2019-07-19 ENCOUNTER — Other Ambulatory Visit: Payer: Self-pay | Admitting: Obstetrics and Gynecology

## 2019-07-20 ENCOUNTER — Encounter (HOSPITAL_COMMUNITY): Payer: Self-pay | Admitting: *Deleted

## 2019-07-20 ENCOUNTER — Inpatient Hospital Stay (HOSPITAL_COMMUNITY): Payer: BC Managed Care – PPO | Admitting: Anesthesiology

## 2019-07-20 ENCOUNTER — Other Ambulatory Visit: Payer: Self-pay

## 2019-07-20 ENCOUNTER — Inpatient Hospital Stay (HOSPITAL_COMMUNITY): Payer: BC Managed Care – PPO

## 2019-07-20 ENCOUNTER — Inpatient Hospital Stay (HOSPITAL_COMMUNITY)
Admission: AD | Admit: 2019-07-20 | Discharge: 2019-07-23 | DRG: 788 | Disposition: A | Discharge status: Home / Self Care | Payer: BC Managed Care – PPO | Attending: Obstetrics and Gynecology | Admitting: Obstetrics and Gynecology

## 2019-07-20 ENCOUNTER — Encounter (HOSPITAL_COMMUNITY)
Admission: AD | Disposition: A | Discharge status: Home / Self Care | Payer: Self-pay | Source: Home / Self Care | Attending: Obstetrics and Gynecology

## 2019-07-20 DIAGNOSIS — Z3A39 39 weeks gestation of pregnancy: Secondary | ICD-10-CM

## 2019-07-20 DIAGNOSIS — E119 Type 2 diabetes mellitus without complications: Secondary | ICD-10-CM | POA: Diagnosis present

## 2019-07-20 DIAGNOSIS — Z794 Long term (current) use of insulin: Secondary | ICD-10-CM

## 2019-07-20 DIAGNOSIS — D649 Anemia, unspecified: Secondary | ICD-10-CM | POA: Diagnosis present

## 2019-07-20 DIAGNOSIS — O2412 Pre-existing diabetes mellitus, type 2, in childbirth: Principal | ICD-10-CM | POA: Diagnosis present

## 2019-07-20 DIAGNOSIS — Z87891 Personal history of nicotine dependence: Secondary | ICD-10-CM

## 2019-07-20 DIAGNOSIS — O24119 Pre-existing diabetes mellitus, type 2, in pregnancy, unspecified trimester: Secondary | ICD-10-CM | POA: Diagnosis present

## 2019-07-20 DIAGNOSIS — O9902 Anemia complicating childbirth: Secondary | ICD-10-CM | POA: Diagnosis present

## 2019-07-20 LAB — RPR: RPR Ser Ql: NONREACTIVE

## 2019-07-20 LAB — CBC
HCT: 29.5 % — ABNORMAL LOW (ref 36.0–46.0)
Hemoglobin: 9.8 g/dL — ABNORMAL LOW (ref 12.0–15.0)
MCH: 29.9 pg (ref 26.0–34.0)
MCHC: 33.2 g/dL (ref 30.0–36.0)
MCV: 89.9 fL (ref 80.0–100.0)
Platelets: 238 10*3/uL (ref 150–400)
RBC: 3.28 MIL/uL — ABNORMAL LOW (ref 3.87–5.11)
RDW: 15.1 % (ref 11.5–15.5)
WBC: 11.2 10*3/uL — ABNORMAL HIGH (ref 4.0–10.5)
nRBC: 0 % (ref 0.0–0.2)

## 2019-07-20 LAB — TYPE AND SCREEN
ABO/RH(D): A POS
Antibody Screen: NEGATIVE

## 2019-07-20 LAB — GLUCOSE, CAPILLARY: Glucose-Capillary: 158 mg/dL — ABNORMAL HIGH (ref 70–99)

## 2019-07-20 SURGERY — Surgical Case
Anesthesia: Epidural | Wound class: Clean Contaminated

## 2019-07-20 MED ORDER — SODIUM CHLORIDE 0.9 % IV SOLN
INTRAVENOUS | Status: DC | PRN
  Administered 2019-07-20: 21:00:00 via INTRAVENOUS

## 2019-07-20 MED ORDER — KETOROLAC TROMETHAMINE 30 MG/ML IJ SOLN: 30.0000 mg | Freq: Once | INTRAMUSCULAR | Status: AC | PRN

## 2019-07-20 MED ORDER — METHYLERGONOVINE MALEATE 0.2 MG/ML IJ SOLN: 0.2000 mg | INTRAMUSCULAR | Status: DC | PRN

## 2019-07-20 MED ORDER — PHENYLEPHRINE 40 MCG/ML (10ML) SYRINGE FOR IV PUSH (FOR BLOOD PRESSURE SUPPORT)
80.0000 ug | PREFILLED_SYRINGE | INTRAVENOUS | Status: DC | PRN
  Filled 2019-07-20: qty 10

## 2019-07-20 MED ORDER — DEXTROSE 5 % IV SOLN
INTRAVENOUS | Status: AC
  Filled 2019-07-20: qty 3000

## 2019-07-20 MED ORDER — NALBUPHINE HCL 10 MG/ML IJ SOLN: 5.0000 mg | Freq: Once | INTRAMUSCULAR | Status: DC | PRN

## 2019-07-20 MED ORDER — MORPHINE SULFATE (PF) 0.5 MG/ML IJ SOLN
INTRAMUSCULAR | Status: AC
  Filled 2019-07-20: qty 10

## 2019-07-20 MED ORDER — SODIUM CHLORIDE (PF) 0.9 % IJ SOLN
INTRAMUSCULAR | Status: DC | PRN
  Administered 2019-07-20: 12 mL/h via EPIDURAL

## 2019-07-20 MED ORDER — OXYCODONE-ACETAMINOPHEN 5-325 MG PO TABS: 2.0000 | ORAL_TABLET | ORAL | Status: DC | PRN

## 2019-07-20 MED ORDER — LIDOCAINE-EPINEPHRINE (PF) 2 %-1:200000 IJ SOLN
INTRAMUSCULAR | Status: AC
  Filled 2019-07-20: qty 10

## 2019-07-20 MED ORDER — MENTHOL 3 MG MT LOZG: 1.0000 | LOZENGE | OROMUCOSAL | Status: DC | PRN

## 2019-07-20 MED ORDER — OXYTOCIN 40 UNITS IN NORMAL SALINE INFUSION - SIMPLE MED
1.0000 m[IU]/min | INTRAVENOUS | Status: DC
  Administered 2019-07-20: 2 m[IU]/min via INTRAVENOUS
  Administered 2019-07-20: 10:00:00 6 m[IU]/min via INTRAVENOUS
  Administered 2019-07-20: 4 m[IU]/min via INTRAVENOUS
  Administered 2019-07-20: 16 m[IU]/min via INTRAVENOUS
  Administered 2019-07-20: 8 m[IU]/min via INTRAVENOUS
  Administered 2019-07-20: 14:00:00 18 m[IU]/min via INTRAVENOUS
  Administered 2019-07-20: 10 m[IU]/min via INTRAVENOUS
  Administered 2019-07-20: 12 m[IU]/min via INTRAVENOUS
  Administered 2019-07-20: 14 m[IU]/min via INTRAVENOUS
  Filled 2019-07-20: qty 1000

## 2019-07-20 MED ORDER — EPHEDRINE 5 MG/ML INJ: 10.0000 mg | INTRAVENOUS | Status: DC | PRN

## 2019-07-20 MED ORDER — LACTATED RINGERS IV SOLN
500.0000 mL | Freq: Once | INTRAVENOUS | Status: AC
  Administered 2019-07-20: 500 mL via INTRAVENOUS

## 2019-07-20 MED ORDER — NALOXONE HCL 0.4 MG/ML IJ SOLN: 0.4000 mg | INTRAMUSCULAR | Status: DC | PRN

## 2019-07-20 MED ORDER — SODIUM CHLORIDE 0.9% FLUSH: 3.0000 mL | INTRAVENOUS | Status: DC | PRN

## 2019-07-20 MED ORDER — KETOROLAC TROMETHAMINE 30 MG/ML IJ SOLN
30.0000 mg | Freq: Four times a day (QID) | INTRAMUSCULAR | Status: AC
  Administered 2019-07-21 (×3): 30 mg via INTRAVENOUS
  Filled 2019-07-20 (×3): qty 1

## 2019-07-20 MED ORDER — ACETAMINOPHEN 325 MG PO TABS: 650.0000 mg | ORAL_TABLET | ORAL | Status: DC | PRN

## 2019-07-20 MED ORDER — MEPERIDINE HCL 25 MG/ML IJ SOLN: 6.2500 mg | INTRAMUSCULAR | Status: DC | PRN

## 2019-07-20 MED ORDER — ENOXAPARIN SODIUM 60 MG/0.6ML ~~LOC~~ SOLN
0.5000 mg/kg | SUBCUTANEOUS | Status: DC
  Administered 2019-07-21 – 2019-07-22 (×2): 60 mg via SUBCUTANEOUS
  Filled 2019-07-20 (×2): qty 0.6

## 2019-07-20 MED ORDER — ONDANSETRON HCL 4 MG/2ML IJ SOLN
INTRAMUSCULAR | Status: AC
  Filled 2019-07-20: qty 2

## 2019-07-20 MED ORDER — TERBUTALINE SULFATE 1 MG/ML IJ SOLN: 0.2500 mg | Freq: Once | INTRAMUSCULAR | Status: DC | PRN

## 2019-07-20 MED ORDER — HYDROMORPHONE HCL 1 MG/ML IJ SOLN: 0.2500 mg | INTRAMUSCULAR | Status: DC | PRN

## 2019-07-20 MED ORDER — OXYTOCIN BOLUS FROM INFUSION: 500.0000 mL | Freq: Once | INTRAVENOUS | Status: DC

## 2019-07-20 MED ORDER — DIBUCAINE (PERIANAL) 1 % EX OINT: 1.0000 "application " | TOPICAL_OINTMENT | CUTANEOUS | Status: DC | PRN

## 2019-07-20 MED ORDER — COCONUT OIL OIL: 1.0000 "application " | TOPICAL_OIL | Status: DC | PRN

## 2019-07-20 MED ORDER — DEXTROSE 5 % IV SOLN
INTRAVENOUS | Status: DC | PRN
  Administered 2019-07-20: 3 g via INTRAVENOUS

## 2019-07-20 MED ORDER — METFORMIN HCL ER 500 MG PO TB24
1000.0000 mg | ORAL_TABLET | Freq: Two times a day (BID) | ORAL | Status: DC
  Filled 2019-07-20: qty 2

## 2019-07-20 MED ORDER — LIDOCAINE HCL (PF) 1 % IJ SOLN: 30.0000 mL | INTRAMUSCULAR | Status: DC | PRN

## 2019-07-20 MED ORDER — ONDANSETRON HCL 4 MG/2ML IJ SOLN
INTRAMUSCULAR | Status: DC | PRN
  Administered 2019-07-20: 4 mg via INTRAVENOUS

## 2019-07-20 MED ORDER — ONDANSETRON HCL 4 MG/2ML IJ SOLN: 4.0000 mg | Freq: Four times a day (QID) | INTRAMUSCULAR | Status: DC | PRN

## 2019-07-20 MED ORDER — LACTATED RINGERS IV SOLN
INTRAVENOUS | Status: DC | PRN
  Administered 2019-07-20 (×2): via INTRAVENOUS

## 2019-07-20 MED ORDER — SOD CITRATE-CITRIC ACID 500-334 MG/5ML PO SOLN
30.0000 mL | ORAL | Status: DC | PRN
  Administered 2019-07-20: 30 mL via ORAL
  Filled 2019-07-20: qty 30

## 2019-07-20 MED ORDER — OXYTOCIN 40 UNITS IN NORMAL SALINE INFUSION - SIMPLE MED: 2.5000 [IU]/h | INTRAVENOUS | Status: DC

## 2019-07-20 MED ORDER — NALOXONE HCL 4 MG/10ML IJ SOLN
1.0000 ug/kg/h | INTRAVENOUS | Status: DC | PRN
  Filled 2019-07-20: qty 5

## 2019-07-20 MED ORDER — LIDOCAINE HCL (PF) 1 % IJ SOLN
INTRAMUSCULAR | Status: DC | PRN
  Administered 2019-07-20: 5 mL via EPIDURAL

## 2019-07-20 MED ORDER — PHENYLEPHRINE 40 MCG/ML (10ML) SYRINGE FOR IV PUSH (FOR BLOOD PRESSURE SUPPORT)
80.0000 ug | PREFILLED_SYRINGE | INTRAVENOUS | Status: DC | PRN
  Administered 2019-07-20: 17:00:00 80 ug via INTRAVENOUS

## 2019-07-20 MED ORDER — NALBUPHINE HCL 10 MG/ML IJ SOLN: 5.0000 mg | INTRAMUSCULAR | Status: DC | PRN

## 2019-07-20 MED ORDER — INSULIN REGULAR(HUMAN) IN NACL 100-0.9 UT/100ML-% IV SOLN
INTRAVENOUS | Status: DC
  Administered 2019-07-20: 0.4 [IU]/h via INTRAVENOUS
  Administered 2019-07-20: 1 [IU]/h via INTRAVENOUS
  Filled 2019-07-20 (×2): qty 100

## 2019-07-20 MED ORDER — ZOLPIDEM TARTRATE 5 MG PO TABS: 5.0000 mg | ORAL_TABLET | Freq: Every evening | ORAL | Status: DC | PRN

## 2019-07-20 MED ORDER — SENNOSIDES-DOCUSATE SODIUM 8.6-50 MG PO TABS
2.0000 | ORAL_TABLET | ORAL | Status: DC
  Administered 2019-07-21 – 2019-07-22 (×2): 2 via ORAL
  Filled 2019-07-20 (×2): qty 2

## 2019-07-20 MED ORDER — DIPHENHYDRAMINE HCL 25 MG PO CAPS: 25.0000 mg | ORAL_CAPSULE | ORAL | Status: DC | PRN

## 2019-07-20 MED ORDER — OXYCODONE-ACETAMINOPHEN 5-325 MG PO TABS: 1.0000 | ORAL_TABLET | ORAL | Status: DC | PRN

## 2019-07-20 MED ORDER — SODIUM CHLORIDE 0.9 % IV SOLN
INTRAVENOUS | Status: DC | PRN
  Administered 2019-07-20: 40 [IU] via INTRAVENOUS

## 2019-07-20 MED ORDER — DIPHENHYDRAMINE HCL 50 MG/ML IJ SOLN: 12.5000 mg | INTRAMUSCULAR | Status: DC | PRN

## 2019-07-20 MED ORDER — OXYTOCIN 40 UNITS IN NORMAL SALINE INFUSION - SIMPLE MED: 2.5000 [IU]/h | INTRAVENOUS | Status: AC

## 2019-07-20 MED ORDER — DIPHENHYDRAMINE HCL 50 MG/ML IJ SOLN
12.5000 mg | INTRAMUSCULAR | Status: DC | PRN
  Administered 2019-07-21: 12.5 mg via INTRAVENOUS
  Filled 2019-07-20: qty 1

## 2019-07-20 MED ORDER — FENTANYL-BUPIVACAINE-NACL 0.5-0.125-0.9 MG/250ML-% EP SOLN
12.0000 mL/h | EPIDURAL | Status: DC | PRN
  Filled 2019-07-20: qty 250

## 2019-07-20 MED ORDER — SIMETHICONE 80 MG PO CHEW
80.0000 mg | CHEWABLE_TABLET | ORAL | Status: DC | PRN
  Administered 2019-07-21 – 2019-07-22 (×2): 80 mg via ORAL
  Filled 2019-07-20 (×2): qty 1

## 2019-07-20 MED ORDER — MAGNESIUM HYDROXIDE 400 MG/5ML PO SUSP: 30.0000 mL | ORAL | Status: DC | PRN

## 2019-07-20 MED ORDER — LACTATED RINGERS IV SOLN
INTRAVENOUS | Status: DC
  Administered 2019-07-20 (×2): via INTRAVENOUS

## 2019-07-20 MED ORDER — TETANUS-DIPHTH-ACELL PERTUSSIS 5-2.5-18.5 LF-MCG/0.5 IM SUSP: 0.5000 mL | Freq: Once | INTRAMUSCULAR | Status: DC

## 2019-07-20 MED ORDER — LACTATED RINGERS IV SOLN
500.0000 mL | INTRAVENOUS | Status: DC | PRN
  Administered 2019-07-20: 300 mL via INTRAVENOUS

## 2019-07-20 MED ORDER — OXYCODONE HCL 5 MG PO TABS: 5.0000 mg | ORAL_TABLET | ORAL | Status: DC | PRN

## 2019-07-20 MED ORDER — LACTATED RINGERS IV SOLN
INTRAVENOUS | Status: DC
  Administered 2019-07-21: 04:00:00 via INTRAVENOUS

## 2019-07-20 MED ORDER — ACETAMINOPHEN 500 MG PO TABS
1000.0000 mg | ORAL_TABLET | Freq: Four times a day (QID) | ORAL | Status: DC
  Administered 2019-07-21 – 2019-07-23 (×9): 1000 mg via ORAL
  Filled 2019-07-20 (×10): qty 2

## 2019-07-20 MED ORDER — ONDANSETRON HCL 4 MG/2ML IJ SOLN: 4.0000 mg | Freq: Three times a day (TID) | INTRAMUSCULAR | Status: DC | PRN

## 2019-07-20 MED ORDER — PROMETHAZINE HCL 25 MG/ML IJ SOLN: 6.2500 mg | INTRAMUSCULAR | Status: DC | PRN

## 2019-07-20 MED ORDER — LIDOCAINE-EPINEPHRINE 2 %-1:100000 IJ SOLN
INTRAMUSCULAR | Status: DC | PRN
  Administered 2019-07-20 (×3): 5 mL via INTRADERMAL

## 2019-07-20 MED ORDER — METHYLERGONOVINE MALEATE 0.2 MG PO TABS: 0.2000 mg | ORAL_TABLET | ORAL | Status: DC | PRN

## 2019-07-20 MED ORDER — DEXTROSE IN LACTATED RINGERS 5 % IV SOLN
INTRAVENOUS | Status: DC
  Administered 2019-07-20 (×2): via INTRAVENOUS

## 2019-07-20 MED ORDER — MEASLES, MUMPS & RUBELLA VAC IJ SOLR: 0.5000 mL | Freq: Once | INTRAMUSCULAR | Status: DC

## 2019-07-20 MED ORDER — BUTORPHANOL TARTRATE 1 MG/ML IJ SOLN
1.0000 mg | INTRAMUSCULAR | Status: DC | PRN
  Administered 2019-07-20: 1 mg via INTRAVENOUS
  Filled 2019-07-20: qty 1

## 2019-07-20 MED ORDER — MORPHINE SULFATE (PF) 0.5 MG/ML IJ SOLN
INTRAMUSCULAR | Status: DC | PRN
  Administered 2019-07-20: 3 mg via EPIDURAL
  Administered 2019-07-20: 2 mg via INTRAVENOUS

## 2019-07-20 MED ORDER — DIPHENHYDRAMINE HCL 25 MG PO CAPS: 25.0000 mg | ORAL_CAPSULE | Freq: Four times a day (QID) | ORAL | Status: DC | PRN

## 2019-07-20 MED ORDER — WITCH HAZEL-GLYCERIN EX PADS: 1.0000 "application " | MEDICATED_PAD | CUTANEOUS | Status: DC | PRN

## 2019-07-20 MED ORDER — SCOPOLAMINE 1 MG/3DAYS TD PT72: 1.0000 | MEDICATED_PATCH | Freq: Once | TRANSDERMAL | Status: DC

## 2019-07-20 MED ORDER — IBUPROFEN 800 MG PO TABS
800.0000 mg | ORAL_TABLET | Freq: Three times a day (TID) | ORAL | Status: DC
  Administered 2019-07-21 – 2019-07-23 (×5): 800 mg via ORAL
  Filled 2019-07-20 (×5): qty 1

## 2019-07-20 MED ORDER — PRENATAL MULTIVITAMIN CH
1.0000 | ORAL_TABLET | Freq: Every day | ORAL | Status: DC
  Administered 2019-07-21 – 2019-07-23 (×3): 1 via ORAL
  Filled 2019-07-20 (×3): qty 1

## 2019-07-20 SURGICAL SUPPLY — 33 items
CHLORAPREP W/TINT 26ML (MISCELLANEOUS) ×2 IMPLANT
CLAMP CORD UMBIL (MISCELLANEOUS) IMPLANT
CLOTH BEACON ORANGE TIMEOUT ST (SAFETY) ×2 IMPLANT
CLSR STERI-STRIP ANTIMIC 1/2X4 (GAUZE/BANDAGES/DRESSINGS) ×1 IMPLANT
DRSG OPSITE POSTOP 4X10 (GAUZE/BANDAGES/DRESSINGS) ×2 IMPLANT
ELECT REM PT RETURN 9FT ADLT (ELECTROSURGICAL) ×2
ELECTRODE REM PT RTRN 9FT ADLT (ELECTROSURGICAL) ×1 IMPLANT
EXTRACTOR VACUUM KIWI (MISCELLANEOUS) IMPLANT
EXTRACTOR VACUUM M CUP 4 TUBE (SUCTIONS) IMPLANT
GLOVE BIOGEL PI IND STRL 7.0 (GLOVE) ×1 IMPLANT
GLOVE BIOGEL PI INDICATOR 7.0 (GLOVE) ×1
GLOVE ORTHO TXT STRL SZ7.5 (GLOVE) ×2 IMPLANT
GOWN STRL REUS W/TWL LRG LVL3 (GOWN DISPOSABLE) ×4 IMPLANT
KIT ABG SYR 3ML LUER SLIP (SYRINGE) IMPLANT
NDL HYPO 25X5/8 SAFETYGLIDE (NEEDLE) ×1 IMPLANT
NEEDLE HYPO 25X5/8 SAFETYGLIDE (NEEDLE) ×2 IMPLANT
NS IRRIG 1000ML POUR BTL (IV SOLUTION) ×2 IMPLANT
PACK C SECTION WH (CUSTOM PROCEDURE TRAY) ×2 IMPLANT
PAD OB MATERNITY 4.3X12.25 (PERSONAL CARE ITEMS) ×2 IMPLANT
PENCIL SMOKE EVAC W/HOLSTER (ELECTROSURGICAL) ×2 IMPLANT
RTRCTR C-SECT PINK 25CM LRG (MISCELLANEOUS) ×2 IMPLANT
SUT CHROMIC 1 CTX 36 (SUTURE) ×4 IMPLANT
SUT PLAIN 0 NONE (SUTURE) IMPLANT
SUT PLAIN 2 0 XLH (SUTURE) IMPLANT
SUT VIC AB 0 CT1 27 (SUTURE) ×2
SUT VIC AB 0 CT1 27XBRD ANBCTR (SUTURE) ×2 IMPLANT
SUT VIC AB 2-0 CT1 (SUTURE) ×2 IMPLANT
SUT VIC AB 2-0 CT1 27 (SUTURE) ×1
SUT VIC AB 2-0 CT1 TAPERPNT 27 (SUTURE) ×1 IMPLANT
SUT VIC AB 4-0 KS 27 (SUTURE) IMPLANT
TOWEL OR 17X24 6PK STRL BLUE (TOWEL DISPOSABLE) ×2 IMPLANT
TRAY FOLEY W/BAG SLVR 14FR LF (SET/KITS/TRAYS/PACK) ×2 IMPLANT
WATER STERILE IRR 1000ML POUR (IV SOLUTION) ×2 IMPLANT

## 2019-07-20 NOTE — Anesthesia Procedure Notes (Signed)
Epidural Patient location during procedure: OB Start time: 07/20/2019 4:13 PM End time: 07/20/2019 4:29 PM  Staffing Anesthesiologist: Barnet Glasgow, MD Performed: anesthesiologist   Preanesthetic Checklist Completed: patient identified, site marked, surgical consent, pre-op evaluation, timeout performed, IV checked, risks and benefits discussed and monitors and equipment checked  Epidural Patient position: sitting Prep: site prepped and draped and DuraPrep Patient monitoring: continuous pulse ox and blood pressure Approach: midline Location: L3-L4 Injection technique: LOR air  Needle:  Needle type: Tuohy  Needle gauge: 17 G Needle length: 9 cm and 9 Needle insertion depth: 8 cm Catheter type: closed end flexible Catheter size: 19 Gauge Catheter at skin depth: 13 cm Test dose: negative  Assessment Events: blood not aspirated, injection not painful, no injection resistance, negative IV test and no paresthesia  Additional Notes Patient identified. Risks/Benefits/Options discussed with patient including but not limited to bleeding, infection, nerve damage, paralysis, failed block, incomplete pain control, headache, blood pressure changes, nausea, vomiting, reactions to medication both or allergic, itching and postpartum back pain. Confirmed with bedside nurse the patient's most recent platelet count. Confirmed with patient that they are not currently taking any anticoagulation, have any bleeding history or any family history of bleeding disorders. Patient expressed understanding and wished to proceed. All questions were answered. Sterile technique was used throughout the entire procedure. Please see nursing notes for vital signs. Test dose was given through epidural needle and negative prior to continuing to dose epidural or start infusion. Warning signs of high block given to the patient including shortness of breath, tingling/numbness in hands, complete motor block, or any  concerning symptoms with instructions to call for help. Patient was given instructions on fall risk and not to get out of bed. All questions and concerns addressed with instructions to call with any issues. 2 Attempt (S) . Patient tolerated procedure well.

## 2019-07-20 NOTE — Op Note (Signed)
Preoperative diagnosis: Intrauterine pregnancy at 39 weeks, FHR decelerations, Type 2 DM Postoperative diagnosis: Same Procedure: Primary low transverse cesarean section without extensions Surgeon: Cheri Fowler M.D. Anesthesia: Epidural Findings: Patient had normal gravid anatomy and delivered a viable female infant with Apgars of 9 and 9 weight pending Estimated blood loss: 150 cc Specimens: Placenta sent to labor and delivery Complications: None  Procedure in detail: The patient was taken to the operating room and placed in the dorsosupine position with left tilt. Her previously placed epidural was placed appropriately.  Abdomen was then prepped and draped in the usual sterile fashion, a foley catheter had previously been inserted. The level of her anesthesia was found to be adequate. Abdomen was entered via a standard Pfannenstiel incision. Once the peritoneal cavity was entered the Alexis disposable self-retaining retractor was placed and good visualization was achieved. A 4 cm transverse incision was then made in the lower uterine segment pushing the bladder inferior. Once the uterine cavity was entered the incision was extended digitally. The fetal vertex was grasped and delivered through the incision atraumatically, direct OP position. Mouth and nares were suctioned. The remainder of the infant then delivered atraumatically. Cord was doubly clamped and cut after one minute and the infant handed to the awaiting pediatric team. Cord blood was obtained. The placenta delivered spontaneously. Uterus was wiped dry with clean lap pad and all clots and debris were removed. Uterine incision was inspected and found to be free of extensions. Uterine incision was closed in 2 layers with running locking #1 Chromic, locking layer for first layer, imbricating layer for second layer. Tubes and ovaries were inspected and found to be normal. Uterine incision was inspected and found to be hemostatic. Bleeding from  serosal edges was controlled with electrocautery. The Alexis retractor was removed. Subfascial space was irrigated and made hemostatic with electrocautery. Peritoneum was closed with 2-0 Vicryl.  Fascia was closed in running fashion starting at both ends and meeting in the middle with 0 Vicryl. Subcutaneous tissue was then irrigated and made hemostatic with electrocautery, then closed with running 2-0 plain gut. Skin was closed with running 4-0 Vicryl subcuticular suture followed by steri-strips and a sterile dressing. Patient tolerated the procedure well and was taken to the recovery in stable condition. Counts were correct x2, she received Ancef 3 g IV at the beginning of the procedure and she had PAS hose on throughout the procedure.

## 2019-07-20 NOTE — H&P (Signed)
Pamela Eaton is a 32 y.o. female, G1P0, EGA [redacted] weeks with EDC 8-30 presenting for induction due to pre-existing Type 2 DM.  Prenatal care complicated by Type 2 DM, followed by Dr. Chalmers Cater, treated with Metformin and insulin.  Normal growth by u/s, normal antepartum surveillance.  AMA, Panorama low risk, female.  On baby ASA through out pregnancy.  OB History    Gravida  1   Para      Term      Preterm      AB      Living  0     SAB      TAB      Ectopic      Multiple      Live Births             Past Medical History:  Diagnosis Date  . Anemia   . Diabetes mellitus without complication (HCC)    T2  . Heart palpitations   . IBS (irritable bowel syndrome)   . Infertility, female   . Polycystic ovarian disease   . Umbilical hernia    Past Surgical History:  Procedure Laterality Date  . CHOLECYSTECTOMY N/A 03/21/2013   Procedure: LAPAROSCOPIC CHOLECYSTECTOMY WITH INTRAOPERATIVE CHOLANGIOGRAM;  Surgeon: Ralene Ok, MD;  Location: [facility] OR;  Service: General;  Laterality: N/A;  . TONSILLECTOMY     Family History: family history includes Asthma in her mother; Diabetes in her father; Migraines in her sister; Parkinson's disease in her mother; Rheum arthritis in her sister; Seizures in her sister; Thyroid disease in her mother. Social History:  reports that she quit smoking about 12 months ago. Her smoking use included cigarettes. She smoked 0.00 packs per day. She has never used smokeless tobacco. She reports previous alcohol use. She reports that she does not use drugs.     Maternal Diabetes: Yes:  Diabetes Type:  Pre-pregnancy, Insulin/Medication controlled Genetic Screening: Normal Maternal Ultrasounds/Referrals: Normal Fetal Ultrasounds or other Referrals:  None Maternal Substance Abuse:  No Significant Maternal Medications:  Meds include: Other:  Significant Maternal Lab Results:  Group B Strep negative Other Comments:  Metformin and insulin, baby  ASA  Review of Systems  Respiratory: Negative.   Cardiovascular: Negative.    Maternal Medical History:  Contractions: Perceived severity is mild.    Fetal activity: Perceived fetal activity is normal.    Prenatal Complications - Diabetes: type 2. Diabetes is managed by insulin injections.        Last menstrual period 10/10/2018. Maternal Exam:  Uterine Assessment: Contraction strength is mild.  Contraction frequency is irregular.   Abdomen: Patient reports no abdominal tenderness. Estimated fetal weight is 7.5 lbs.   Fetal presentation: vertex  Introitus: Normal vulva. Normal vagina.  Amniotic fluid character: not assessed.  Pelvis: adequate for delivery.      Fetal Exam Fetal Monitor Review: Mode: ultrasound.   Baseline rate: 140.  Variability: moderate (6-25 bpm).   Pattern: no decelerations.    Fetal State Assessment: Category I - tracings are normal.     Physical Exam  Vitals reviewed. Constitutional: She appears well-developed and well-nourished.  Cardiovascular: Normal rate and regular rhythm.  Respiratory: Effort normal. No respiratory distress.  GI: Soft.  Genitourinary:    Vulva normal.     Prenatal labs: ABO, Rh: --/--/PENDING (08/24 0745) Antibody: PENDING (08/24 0745) Rubella: Immune (01/21 0000) RPR: Nonreactive (01/21 0000)  HBsAg: Negative (01/21 0000)  HIV: Non-reactive (01/21 0000)  GBS:   neg  Assessment/Plan: IUP at 39  weeks, Type 2 DM, for induction.  Admission CBG 150, will start on glucose stabilizer protocol.  Will start pitocin, monitor progress   Pamela Eaton 07/20/2019, 8:34 AM

## 2019-07-20 NOTE — Anesthesia Preprocedure Evaluation (Signed)
Anesthesia Evaluation  Patient identified by MRN, date of birth, ID band Patient awake    Reviewed: Allergy & Precautions, NPO status , Patient's Chart, lab work & pertinent test results  Airway Mallampati: II  TM Distance: >3 FB Neck ROM: Full    Dental no notable dental hx. (+) Teeth Intact   Pulmonary neg pulmonary ROS, former smoker,    Pulmonary exam normal breath sounds clear to auscultation       Cardiovascular Exercise Tolerance: Good Normal cardiovascular exam Rhythm:Regular Rate:Normal     Neuro/Psych negative neurological ROS  negative psych ROS   GI/Hepatic   Endo/Other  diabetes, Type 2  Renal/GU      Musculoskeletal   Abdominal (+) + obese,   Peds  Hematology  (+) anemia , Hgb 9.8 Plt 238   Anesthesia Other Findings   Reproductive/Obstetrics (+) Pregnancy                             Anesthesia Physical Anesthesia Plan  ASA: III  Anesthesia Plan: Epidural   Post-op Pain Management:    Induction:   PONV Risk Score and Plan:   Airway Management Planned:   Additional Equipment:   Intra-op Plan:   Post-operative Plan:   Informed Consent: I have reviewed the patients History and Physical, chart, labs and discussed the procedure including the risks, benefits and alternatives for the proposed anesthesia with the patient or authorized representative who has indicated his/her understanding and acceptance.     Dental advisory given  Plan Discussed with:   Anesthesia Plan Comments: (G1Po w DM2 and Anemia for LEA)        Anesthesia Quick Evaluation

## 2019-07-20 NOTE — Progress Notes (Signed)
Feeling some ctx Afeb, VSS FHT- 130s, Cat I, irreg ctx VE-2-3/70/-2, vtx, AROM light meconium Managing sugars with glucose stabilizer, continue pitocin, monitor progress

## 2019-07-20 NOTE — Transfer of Care (Signed)
Immediate Anesthesia Transfer of Care Note  Patient: Pamela Eaton  Procedure(s) Performed: CESAREAN SECTION (N/A )  Patient Location: PACU  Anesthesia Type:Epidural  Level of Consciousness: awake, alert  and oriented  Airway & Oxygen Therapy: Patient Spontanous Breathing  Post-op Assessment: Report given to RN and Post -op Vital signs reviewed and stable  Post vital signs: Reviewed and stable  Last Vitals:  Vitals Value Taken Time  BP 101/64 07/20/19 2145  Temp    Pulse 100 07/20/19 2147  Resp 30 07/20/19 2147  SpO2 97 % 07/20/19 2147  Vitals shown include unvalidated device data.  Last Pain:  Vitals:   07/20/19 2000  TempSrc:   PainSc: 0-No pain      Patients Stated Pain Goal: 8 (74/[id number]/49 [id number])  Complications: No apparent anesthesia complications

## 2019-07-20 NOTE — Progress Notes (Signed)
Just got epidural Afeb, VSS FHT- 130s, mod variability, + accels and scalp stim, some variable decels, Cat II VE-4/90/-1, vtx, unable to place IUPC Will continue pitocin and monitor progress, continue glucose stabilizer

## 2019-07-20 NOTE — Progress Notes (Signed)
Comfortable with epidural Afeb, VSS FHT- 130s, mod variability, previously with some variable and prolonged decels, now with what look like late decels, Cat II, ctx not tracing well VE 6/80/0 about 30 minutes ago I was initially called at 1719 about the FHT.  At that time the nurse thought there was a good chance of improving the tracing with bringing her BP up from her epidural and position change.  Pitocin had also been reduced in half.  A second call from the nurse at 1904 requested I come evaluate the tracing.  As mentioned above, the tracing has gone from variable and prolonged decels to what look like late decels, although not tracing ctx well.  I was unable to place an IUPC.  Due to still being remote from delivery and persistent Cat II tracing now with late decels I have recommended a c-section.  C-section procedure and risks discussed, will proceed when the OR is ready.

## 2019-07-21 ENCOUNTER — Encounter (HOSPITAL_COMMUNITY): Payer: Self-pay

## 2019-07-21 LAB — CBC
HCT: 26.4 % — ABNORMAL LOW (ref 36.0–46.0)
Hemoglobin: 8.7 g/dL — ABNORMAL LOW (ref 12.0–15.0)
MCH: 29.9 pg (ref 26.0–34.0)
MCHC: 33 g/dL (ref 30.0–36.0)
MCV: 90.7 fL (ref 80.0–100.0)
Platelets: 200 10*3/uL (ref 150–400)
RBC: 2.91 MIL/uL — ABNORMAL LOW (ref 3.87–5.11)
RDW: 15.1 % (ref 11.5–15.5)
WBC: 11.7 10*3/uL — ABNORMAL HIGH (ref 4.0–10.5)
nRBC: 0 % (ref 0.0–0.2)

## 2019-07-21 LAB — ABO/RH: ABO/RH(D): A POS

## 2019-07-21 MED ORDER — SCOPOLAMINE 1 MG/3DAYS TD PT72
1.0000 | MEDICATED_PATCH | Freq: Once | TRANSDERMAL | Status: DC
  Administered 2019-07-21: 01:00:00 1.5 mg via TRANSDERMAL
  Filled 2019-07-21: qty 1

## 2019-07-21 MED ORDER — METFORMIN HCL 500 MG PO TABS
1000.0000 mg | ORAL_TABLET | Freq: Two times a day (BID) | ORAL | Status: DC
  Administered 2019-07-21 – 2019-07-23 (×5): 1000 mg via ORAL
  Filled 2019-07-21 (×11): qty 2

## 2019-07-21 MED ORDER — INSULIN GLARGINE 100 UNIT/ML ~~LOC~~ SOLN
10.0000 [IU] | Freq: Every day | SUBCUTANEOUS | Status: DC
  Administered 2019-07-21 – 2019-07-23 (×3): 10 [IU] via SUBCUTANEOUS
  Filled 2019-07-21 (×4): qty 0.1

## 2019-07-21 MED ORDER — FERROUS FUMARATE 324 (106 FE) MG PO TABS
1.0000 | ORAL_TABLET | Freq: Every day | ORAL | Status: DC
  Administered 2019-07-21 – 2019-07-23 (×3): 106 mg via ORAL
  Filled 2019-07-21 (×3): qty 1

## 2019-07-21 NOTE — Addendum Note (Signed)
Addendum  created 07/21/19 0626 by Alvy Bimler, CRNA   Clinical Note Signed

## 2019-07-21 NOTE — Lactation Note (Signed)
This note was copied from a baby's chart. Lactation Consultation Note  Patient Name: Pamela Eaton [id number] Date: 07/21/2019 Reason for consult: Initial assessment;Term;Primapara;1st time breastfeeding  P1 mother whose infant is now 67 hours old.  Mother requested lactation assistance.  Baby was asleep in mother's arms when I arrived.  She had recently breast fed for 10 minutes.  Offered to assist with latching and mother accepted.  Mother's breasts are large, soft and non tender and nipples are short shafted and intact.  Breast tissue is compressible.  Mother interested in trying the football hold.  Assisted baby to latch to the right breast in the football hold.  However, once at the breast, she pushed back and was not interested in sucking.  Repeated this with the same outcome.  Placed baby STS and she calmed.  Mother was having a difficult time keeping her eyes open.  Suggested she call her RN/LC at the next feed for assistance if needed.    Reviewed feeding cues and hand expression with mother.  She will continue to feed on cue or at least 8-12 times/24 hours.    Mother has a DEBP for home use.  Mom made aware of O/P services, breastfeeding support groups, community resources, and our phone # for post-discharge questions.  Father present.  RN updated.   Maternal Data Formula Feeding for Exclusion: No Has patient been taught Hand Expression?: Yes Does the patient have breastfeeding experience prior to this delivery?: No  Feeding Feeding Type: Breast Fed  LATCH Score Latch: Too sleepy or reluctant, no latch achieved, no sucking elicited.  Audible Swallowing: None  Type of Nipple: Everted at rest and after stimulation(short shafted)  Comfort (Breast/Nipple): Soft / non-tender  Hold (Positioning): Assistance needed to correctly position infant at breast and maintain latch.  LATCH Score: 5  Interventions Interventions: Breast feeding basics reviewed;Assisted with  latch;Skin to skin;Hand express;Breast compression;Adjust position;Position options  Lactation Tools Discussed/Used     Consult Status Consult Status: Follow-up Date: 07/22/19 Follow-up type: In-patient    Pamela Eaton 07/21/2019, 11:31 AM

## 2019-07-21 NOTE — Anesthesia Postprocedure Evaluation (Signed)
Anesthesia Post Note  Patient: Pamela Eaton  Procedure(s) Performed: CESAREAN SECTION (N/A )     Patient location during evaluation: PACU Anesthesia Type: Epidural Level of consciousness: awake and alert Pain management: pain level controlled Vital Signs Assessment: post-procedure vital signs reviewed and stable Respiratory status: spontaneous breathing and respiratory function stable Cardiovascular status: blood pressure returned to baseline and stable Postop Assessment: spinal receding Anesthetic complications: no    Last Vitals:  Vitals:   07/21/19 0111 07/21/19 0220  BP: (P) 112/64 (P) 103/72  Pulse: (P) 81 (P) 79  Resp: (P) 18   Temp: (P) 36.8 C (P) 36.7 C  SpO2: (P) 95% (P) 95%    Last Pain:  Vitals:   07/21/19 0220  TempSrc: (P) Oral  PainSc:                  Nolon Nations

## 2019-07-21 NOTE — Progress Notes (Signed)
Notified Dr Terri Piedra of current cbg , see new orders

## 2019-07-21 NOTE — Progress Notes (Signed)
Patient ID: Pamela Eaton, female   DOB: 1986/02/02, 33 y.o.   MRN: [id number] POD#1 Pt doing well. Reports pain well controlled. Denies HA/CP/SOB. She is ambulating and voiding well. She is bonding well with baby - breastfeeding.  VSS - 126/55, 88,  GEN - NAD ABD - FF, dressing c/d/i EXT - no homans, mild edema b/l  FSBS - fasting 98, post breaktfast 114 11.7>8.7<200  A/P: POD#1 s/p pltc/s for NRFHT - stable         DM - controlled         Anemia - start iron daily         Routine pp/post op care

## 2019-07-21 NOTE — Progress Notes (Signed)
Patient states she will be ordering lunch soon.

## 2019-07-21 NOTE — Plan of Care (Signed)
  Problem: Education: Goal: Knowledge of condition will improve 07/21/2019 0505 by Yancey Flemings, RN Outcome: Adequate for Discharge 07/21/2019 0503 by Yancey Flemings, RN Outcome: Adequate for Discharge

## 2019-07-21 NOTE — Anesthesia Postprocedure Evaluation (Signed)
Anesthesia Post Note  Patient: Pamela Eaton  Procedure(s) Performed: CESAREAN SECTION (N/A )     Patient location during evaluation: OB High Risk Anesthesia Type: Epidural Level of consciousness: awake and alert Pain management: pain level controlled Vital Signs Assessment: post-procedure vital signs reviewed and stable Respiratory status: spontaneous breathing, nonlabored ventilation and respiratory function stable Cardiovascular status: stable Postop Assessment: no headache, no backache and epidural receding Anesthetic complications: no    Last Vitals:  Vitals:   07/21/19 0513 07/21/19 0648  BP:    Pulse:    Resp: 18   Temp:    SpO2: 96% 96%    Last Pain:  Vitals:   07/21/19 0648  TempSrc:   PainSc: 0-No pain   Pain Goal: Patients Stated Pain Goal: 8 (07/20/19 1339)                 Gilmer Mor

## 2019-07-21 NOTE — Lactation Note (Addendum)
This note was copied from a baby's chart. Lactation Consultation Note  Patient Name: Pamela Eaton [id number] Date: 07/21/2019  P1, 63 hour female infant. LC entered the  room infant asleep in basinet and mom starting to  eat dinner, Mom prefers if [facility] services will come back later tonight.    Maternal Data    Feeding    LATCH Score                   Interventions    Lactation Tools Discussed/Used     Consult Status      [name] 07/21/2019, 8:23 PM

## 2019-07-21 NOTE — Progress Notes (Signed)
MOB was referred for history of anxiety.  * Referral screened out by Clinical Social Worker because none of the following criteria appear to apply:  ~ History of anxiety/depression during this pregnancy, or of post-partum depression following prior delivery. ~ Diagnosis of anxiety and/or depression within last 3 years. Per PNC records, MOB diagnosed with anxiety as a child and notes indicate she utilizes cognitive behavioral therapy. No concerns noted in PNC records. OR * MOB's symptoms currently being treated with medication and/or therapy.  Please contact the Clinical Social Worker if needs arise, by MOB request, or if MOB scores greater than 9/yes to question 10 on Edinburgh Postpartum Depression Screen.  Jaclyn Carew, LCSWA  [facility] [phone]   

## 2019-07-21 NOTE — Lactation Note (Signed)
This note was copied from a baby's chart. Lactation Consultation Note  Patient Name: Pamela Eaton [id number] Date: 07/21/2019  P1, 65 hour female infant, mom with C/S delivery.  Per mom, she feels infant is starting to latch well now previously she felt infant had a few shallow latches.  Infant did not start latch well until 4 pm tonight and she has made attempts to feed infant earlier and did a lot of STS.  Mom breastfeed infant 1 hour prior to [facility] entering the room. LC did not observe a latch at this time.  Mom knows to breastfeed according hunger cues, 8 to 12 times within 24 hours and on demand . Mom knows to call Nurse or LC if she has any questions or concerns.   Maternal Data    Feeding Feeding Type: Breast Fed  LATCH Score                   Interventions    Lactation Tools Discussed/Used     Consult Status      [name] 07/21/2019, 11:10 PM

## 2019-07-21 NOTE — Plan of Care (Signed)
  Problem: Education: Goal: Knowledge of condition will improve Outcome: Adequate for Discharge   

## 2019-07-22 NOTE — Progress Notes (Signed)
Subjective: Postpartum Day 2: Cesarean Delivery Patient reports tolerating PO, + flatus and no problems voiding.  Concerned as infant rolled off of lap this AM while breastfeeding; MD currently at bedside assessing baby. Reports FBS in 110s, highest postprandial 140s  Objective: Vital signs in last 24 hours: Temp:  [97.7 F (36.5 C)-99.2 F (37.3 C)] 97.7 F (36.5 C) (08/26 0525) Pulse Rate:  [79-87] 86 (08/26 0525) Resp:  [17-20] 20 (08/26 0525) BP: (110-127)/(55-67) 127/61 (08/26 0525) SpO2:  [98 %-100 %] 100 % (08/26 0525)  Physical Exam:  General: alert, no distress and morbidly obese Lochia: appropriate Uterine Fundus: firm Incision: healing well, honeycomb in place DVT Evaluation: No evidence of DVT seen on physical exam.  Recent Labs    07/20/19 0723 07/21/19 0650  HGB 9.8* 8.7*  HCT 29.5* 26.4*    Assessment/Plan: 33yo now G1P1001 POD#2 s/p PLTCS @ 39 0/7 for NRFHT. PNC c/b T2DM on Metformin plus insulin (held at this time)  Doing well POD#2  -DM: To follow up with Endocrine Chalmers Cater) managing MD, pt to call this AM for review BG. Continue Metformin 1000mg  BID -Postop - meeting milestones  Plan for DC tomorrow POD#3 as infant to be monitored until tomorrow AM per Pediatrician.  [place] 07/22/2019, 11:28 AM

## 2019-07-22 NOTE — Lactation Note (Signed)
This note was copied from a baby's chart. Lactation Consultation Note  Patient Name: Pamela Eaton TOIZT'I Date: 07/22/2019 Reason for consult: Follow-up assessment   P1, Baby 77 hours old.  Hx PCOS, DM and infertility.  6.6% weight loss.  Assisted with latching baby in cradle hold.  Baby eagerly latched and sustained latch for 20 min. Baby has been sleepy. Mother stated baby cluster fed last night and mother's nipples are tender. For soreness suggest mother apply ebm or nipple cream while wearing shells and alternate with comfort gels. DEBP is set up and mother pumped this morning with no volume. Encouraged mother to post pump after every other feeding. Feed on demand approximately 8-12 times per day.   Call for assistance as needed.  Encouraged parents to rest between feedings.      Maternal Data Has patient been taught Hand Expression?: Yes Does the patient have breastfeeding experience prior to this delivery?: No  Feeding Feeding Type: Breast Fed  LATCH Score Latch: Grasps breast easily, tongue down, lips flanged, rhythmical sucking.  Audible Swallowing: A few with stimulation  Type of Nipple: Everted at rest and after stimulation  Comfort (Breast/Nipple): Filling, red/small blisters or bruises, mild/mod discomfort  Hold (Positioning): Assistance needed to correctly position infant at breast and maintain latch.  LATCH Score: 7  Interventions Interventions: Breast feeding basics reviewed;Assisted with latch;Skin to skin;Hand express;Breast compression;Adjust position;Support pillows;Position options;Shells;Comfort gels;DEBP  Lactation Tools Discussed/Used Tools: Shells;Pump;Comfort gels   Consult Status Consult Status: Follow-up Date: 07/22/19 Follow-up type: In-patient    [facility] 07/22/2019, 12:26 PM

## 2019-07-23 LAB — GLUCOSE, CAPILLARY: Glucose-Capillary: 88 mg/dL (ref 70–99)

## 2019-07-23 MED ORDER — OXYCODONE HCL 5 MG PO TABS: 5.0000 mg | ORAL_TABLET | Freq: Four times a day (QID) | ORAL | 0 refills | Status: AC | PRN

## 2019-07-23 MED ORDER — ENOXAPARIN SODIUM 60 MG/0.6ML ~~LOC~~ SOLN: 0.5000 mg/kg | SUBCUTANEOUS | 0 refills | Status: DC

## 2019-07-23 MED ORDER — FERROUS FUMARATE 324 (106 FE) MG PO TABS: 1.0000 | ORAL_TABLET | Freq: Every day | ORAL | 1 refills | Status: AC

## 2019-07-23 MED ORDER — PRENATAL MULTIVITAMIN CH: 1.0000 | ORAL_TABLET | Freq: Every day | ORAL | 3 refills | Status: AC

## 2019-07-23 MED ORDER — IBUPROFEN 800 MG PO TABS: 800.0000 mg | ORAL_TABLET | Freq: Three times a day (TID) | ORAL | 1 refills | Status: AC

## 2019-07-23 NOTE — Discharge Summary (Signed)
OB Discharge Summary     Patient Name: Pamela NeatMeghan Eaton DOB: [birth year] MRN: [id number]  Date of admission: 07/20/2019 Delivering MD: Lavina HammanMEISINGER, TODD   Date of discharge: 07/23/2019  Admitting diagnosis: pregnancy Intrauterine pregnancy: 497w1d     Secondary diagnosis:  Active Problems:   Pre-existing type 2 diabetes mellitus during pregnancy   Fetal heart rate decelerations, delivered  Additional problems: failed IOL     Discharge diagnosis: Term Pregnancy Delivered                                                                                                Post partum procedures:N/A  Augmentation: Pitocin  Complications: None  Hospital course:  Induction of Labor With Cesarean Section  33 y.o. yo G1P1001 at 287w1d was admitted to the hospital 07/20/2019 for induction of labor. Patient had a labor course significant for DM2. The patient went for cesarean section due to decelerations, and delivered a Viable infant,07/20/2019  Membrane Rupture Time/Date: 12:26 PM ,07/20/2019   Details of operation can be found in separate operative Note.  Patient had an uncomplicated postpartum course. She is ambulating, tolerating a regular diet, passing flatus, and urinating well.  Patient is discharged home in stable condition on 07/23/19.                                    Physical exam  Vitals:   07/22/19 0525 07/22/19 1403 07/22/19 2103 07/23/19 0626  BP: 127/61 122/75 116/60 128/70  Pulse: 86 90 89 87  Resp: 20 18 18 17   Temp: 97.7 F (36.5 C) 98.2 F (36.8 C) 98.1 F (36.7 C) 98.2 F (36.8 C)  TempSrc: Oral Oral Oral Oral  SpO2: 100%   100%  Weight:      Height:       General: alert and no distress Lochia: appropriate Uterine Fundus: firm Incision: Healing well with no significant drainage DVT Evaluation: No evidence of DVT seen on physical exam. Labs: Lab Results  Component Value Date   WBC 11.7 (H) 07/21/2019   HGB 8.7 (L) 07/21/2019   HCT 26.4 (L) 07/21/2019   MCV 90.7  07/21/2019   PLT 200 07/21/2019   CMP Latest Ref Rng & Units 03/20/2013  Glucose 70 - 99 mg/dL 086(V157(H)  BUN 6 - 23 mg/dL 13  Creatinine 7.840.50 - 6.961.10 mg/dL 2.950.84  Sodium 284135 - 132145 mEq/L 138  Potassium 3.5 - 5.1 mEq/L 3.8  Chloride 96 - 112 mEq/L 101  CO2 19 - 32 mEq/L 23  Calcium 8.4 - 10.5 mg/dL 10.7(H)  Total Protein 6.0 - 8.3 g/dL 7.9  Total Bilirubin 0.3 - 1.2 mg/dL 0.4  Alkaline Phos 39 - 117 U/L 84  AST 0 - 37 U/L 15  ALT 0 - 35 U/L 15    Discharge instruction: per After Visit Summary and "Baby and Me Booklet".  After visit meds:  Allergies as of 07/23/2019      Reactions   Glipizide Other (See Comments)   Causes low blood sugar  Medication List    TAKE these medications   Basaglar KwikPen 100 UNIT/ML Sopn Inject 30 Units into the skin daily.   Ferrous Fumarate 324 (106 Fe) MG Tabs tablet Commonly known as: HEMOCYTE - 106 mg FE Take 1 tablet (106 mg of iron total) by mouth daily.   ibuprofen 800 MG tablet Commonly known as: ADVIL Take 1 tablet (800 mg total) by mouth every 8 (eight) hours.   Insulin Pen Needle 31G X 5 MM Misc Use as directed with insulin pen   metFORMIN 500 MG 24 hr tablet Commonly known as: GLUCOPHAGE-XR Take 500 mg by mouth. Takes 1000mg  at night   omeprazole 20 MG capsule Commonly known as: PRILOSEC Take 1 capsule by mouth daily.   OneTouch Verio test strip Generic drug: glucose blood   oxyCODONE 5 MG immediate release tablet Commonly known as: Oxy IR/ROXICODONE Take 1-2 tablets (5-10 mg total) by mouth every 6 (six) hours as needed for moderate pain or severe pain.   prenatal multivitamin Tabs tablet Take 1 tablet by mouth daily at 12 noon.       Diet: routine diet  Activity: Advance as tolerated. Pelvic rest for 6 weeks.   Outpatient follow up:2 and 6 weeks Follow up Appt:No future appointments. Follow up Visit:No follow-ups on file.  Postpartum contraception: Undecided  Newborn Data: Live born female  Birth  Weight: 6 lb 15.3 oz (3155 g) APGAR: 8, 9  Newborn Delivery   Birth date/time: 07/20/2019 20:51:00 Delivery type: C-Section, Low Transverse Trial of labor: No C-section categorization: Primary      Baby Feeding: Bottle and Breast Disposition:home with mother   07/23/2019 Janyth Contes, MD

## 2019-07-23 NOTE — Lactation Note (Signed)
This note was copied from a baby's chart. Lactation Consultation Note Mom called for [facility] assistance d/t baby not satisfied at the breast. Crying and cluster feeding. Mom states having pain when feeding. States baby is chomping on her nipple. Lt. Nipple has blister and bruising. Short shaft nipple in shape of lipstick.  Assessed baby's suck w/gloved finger. Baby is chomping. Doesn't extend tongue past gums, feeling chomping from gums. Appears to have tight frenulum. Mouth and lips are starting to appear to be dry.  Fitted mom w/#20 NS. Baby has small mouth, wouldn't be able to use #24 NS. Mom has tubular breast w/nipples at the bottom of her breast tucked under her breast. Taught application of NS. Mom can pull her nipples up and apply NS well. Breast are soft needs firmed w/latching. Demonstrated lip and chin flanging. Baby opens mouth well for latching. Baby BF well for 10 min. No colostrum noted in NS. No swallows heard.  LC hand expressed w/no colostrum noted.  Mom is using DEBP states she does get smear of colostrum to flange. Discussed w/mom supplementation. Mom in agreement.  Noted out put starting to lessen.  Discussed options of supplementation. Mom choose SNS.  Similac 20 cal. Given. Taught mom how to use SNS in #20 NS. Baby took 20 ml.  Mom states she understands may give formula every 2 1/2 -3 hrs, if baby wants to feed may go to breast on demand w/o supplementing. Mom demonstrated application of NS w/SNS. LC demonstrated cleaning. Encouraged mom to hydrate and pump Q 3hrs for stimulation.  Discussed power pumping once she is home and milk storage.  Baby appears slightly jaundice. RN notified of feeding plan.  Patient Name: Pamela Eaton [id number] Date: 07/23/2019     Maternal Data    Feeding Feeding Type: (P) Formula  LATCH Score                   Interventions    Lactation Tools Discussed/Used     Consult Status      Pamela Eaton  G 07/23/2019, 5:02 AM

## 2019-07-23 NOTE — Progress Notes (Addendum)
Subjective: Postpartum Day 3: Cesarean Delivery Patient reports incisional pain, tolerating PO and no problems voiding.  Nl lochia, pain controlled  Objective: Vital signs in last 24 hours: Temp:  [98.1 F (36.7 C)-98.2 F (36.8 C)] 98.2 F (36.8 C) (08/27 0626) Pulse Rate:  [87-90] 87 (08/27 0626) Resp:  [17-18] 17 (08/27 0626) BP: (116-128)/(60-75) 128/70 (08/27 0626) SpO2:  [100 %] 100 % (08/27 0626) FSBS this am 88, all less than 200  Physical Exam:  General: alert and no distress Lochia: appropriate Uterine Fundus: firm Incision: healing well, has bruising on abdomen. DVT Evaluation: No evidence of DVT seen on physical exam.  Recent Labs    07/21/19 0650  HGB 8.7*  HCT 26.4*    Assessment/Plan: Status post Cesarean section. Doing well postoperatively.  Discharge home with standard precautions and return to clinic in 2 and 6 weeks.  D/C with mootrin and PNV, iron, and oxycodone.  F/u w Chalmers Cater re DM (metforim and Basaglar)  Pamela Eaton 07/23/2019, 7:58 AM

## 2019-07-23 NOTE — Lactation Note (Signed)
This note was copied from a baby's chart. Lactation Consultation Note  Patient Name: Pamela Eaton PJASN'K Date: 07/23/2019 Reason for consult: Follow-up assessment;Nipple pain/trauma   Baby 86 hours old.  9% weight loss.  Hx of PCOS. Mother has been pumping and noted colostrum on flanges after pumping. Baby latched in football hold with SNS and formula using #20NS. Baby took approx 35 ml.  Reviewed volume guidelines increasing per day of life and as baby desires.   Suggest trying at least once per day without NS. Watch for volume in NS after feeding. Mother states her nipples still are tender and feels baby is chomping.  She has comfort gels, shells and personal nipple cream and knows to alternate. Mother has DEBP at home.   Discussed having Peds MD to evaulate baby if pain continues.  Encouraged her to continue to post pump after every other feeding. Feed on demand approximately 8-12 times per day.   Reviewed engorgement care and monitoring voids/stools. Put in message for OP appt.     Maternal Data    Feeding Feeding Type: Breast Fed  LATCH Score Latch: Grasps breast easily, tongue down, lips flanged, rhythmical sucking.  Audible Swallowing: A few with stimulation  Type of Nipple: Everted at rest and after stimulation  Comfort (Breast/Nipple): Filling, red/small blisters or bruises, mild/mod discomfort  Hold (Positioning): No assistance needed to correctly position infant at breast.  LATCH Score: 8  Interventions Interventions: Breast feeding basics reviewed;DEBP  Lactation Tools Discussed/Used Tools: Shells;Pump Nipple shield size: 20 Shell Type: Inverted Breast pump type: Double-Electric Breast Pump   Consult Status Consult Status: Complete Date: 07/23/19    Vivianne Master [facility] 07/23/2019, 12:00 PM

## 2019-07-30 ENCOUNTER — Ambulatory Visit: Payer: Self-pay

## 2019-07-30 NOTE — Lactation Note (Signed)
This note was copied from a baby's chart. Lactation Consultation Note  Patient Name: Pamela PayerSusan Layne Yowell Today's Date: 07/30/2019     07/30/2019  Name: Pamela Eaton MRN: [id number] Date of Birth: 07/20/2021 Gestational Age: Gestational Age: 8874w1d Birth Weight: 111.3 oz Weight today:    7 pounds 2.4 ounces (3244 grams) in a clean newborn diaper  10 day old infant presents today with mom for feeding assessment. Mom reports she had sore nipples in the hospital and used the SNS. Recently she has been latching about once a day. Mom concerned about medications she is taking.  Mom reports she got Mastitis and was started on Dicloxicillin last Thursday. Mom reports she had swelling in the areola and a fever.   Infant has gained 374 grams in the last 7 days with an average daily weight gain of 53 grams a day.   Infant self awakens to feed about every 2-3 hours and tends to be more fussy at night. Mom reports if infant eats more than 2 ounces at a time, she will get gassy and irritable.   Infant is BF maybe once a day for short periods. Infant is being supplemented with bottles of EBM and formula. Infant using a Tommie Tippee bottle for feedings, mom denies choking or drooling on the bottle.   Mom reports infant initially chomped on the breast, she has improved more recently. Infant latched to the right breast in the cross cradle hold and latched easily. Infant ate for a little while and then fell asleep.   Mom reports her right breast is a cup size larger than the left breast since pregnancy. Left breast produces more milk per mom. Mom has been storing her milk that she is pumping as she was not sure the Medications she was on was ok with BF.   Mom is pumping about 4 x a day. 2 x for 20 minutes and 2 power pumps for an hour each time. Mom reports supply has increased since starting power pumping. Reviewed supply and demand and enc mom to pump 7-8 x a day if infant not feeding at the breast.    Infant with thin labial frenulum that inserts at the bottom of the gum ridge. Upper lip tight and blanches with flanging. Infant with sucking blister to top upper lip. Infant with intermittent clicking on the breast and the bottle. Mom reports infant likes to chomp on the breast. Infant with short posterior lingual frenulum with minimal mid tongue elevation noted. Infant with strong suckle on gloved finger with good tongue cupping noted. Infant with good tongue extension and lateralization. Mom given website information and local provider information. Enc mom to research and she and dad can make a decision if they want to have infant evaluated.   Mom is unsure when infant is to follow up with Ped but will call to determine appt. Infant to follow up with as needed or 1-5 days post tongue and lip releases if completed.   Reviewed with mom that she has some risk for low milk supply related to PCOS, Diabetes, and infertility. Discussed we may not know if she will make a full milk supply and only time will tell. Mom voiced understanding. Discussed we need to be careful with herbal supplementation due to her preexisting conditions. Mom voiced understanding.        General Information: Mother's reason for visit: Feeding assessment Consult: Initial Lactation consultant: Noralee StainSharon Mariely Mahr RN,IBCLC Breastfeeding experience: has not been breast feeding Maternal medical conditions:  Polycystic ovarian syndrome, Diabetes, Other, Infertility(Metformin and insulin prior to and during pregnancy) Maternal medications: Pre-natal vitamin, Motrin (ibuprofen), Iron, Stool softener, Tylenol (acetaminophen), Percocet, Other(Dicloxicillin)  Breastfeeding History: Frequency of breast feeding: not latching    Supplementation: Supplement method: bottle(Tommie Tippee) Brand: Similac Formula volume: 2-3 ounces Formula frequency: every 2-3 hours         Pump type: Medela pump in style Pump frequency: 4 x a day, 2  power pumps a day Pump volume: 1 ounce  Infant Output Assessment: Voids per 24 hours: 8 Urine color: Clear yellow Stools per 24 hours: 6-8 Stool color: Yellow  Breast Assessment: Breast: Soft, Compressible, Asymmetrical(right breast one cup size bigger that the left breast) Nipple: Erect Pain level: 0 Pain interventions: Bra, Other, Breast pump(Nipple Butter)  Feeding Assessment: Infant oral assessment: Variance Infant oral assessment comment: see note Positioning: Football(right breast, 10 minutes) Latch: 2 - Grasps breast easily, tongue down, lips flanged, rhythmical sucking. Audible swallowing: 2 - Spontaneous and intermittent Type of nipple: 2 - Everted at rest and after stimulation Comfort: 1 - Filling, red/small blisters or bruises, mild/mod discomfort Hold: 1 - Assistance needed to correctly position infant at breast and maintain latch LATCH score: 8 Latch assessment: Deep Lips flanged: Yes Suck assessment: Displays both   Pre-feed weight: 3244 grams Post feed weight: 3254 grams Amount transferred: 10 ml Amount supplemented: 0  Additional Feeding Assessment: Infant oral assessment: Variance Infant oral assessment comment: see note Positioning: Cross cradle(left breast, 15 minutes) Latch: 2 - Grasps breast easily, tongue down, lips flanged, rhythmical sucking. Audible swallowing: 2 - Spontaneous and intermittent Type of nipple: 2 - Everted at rest and after stimulation Comfort: 2 - Soft/non-tender Hold: 1 - Assistance needed to correctly position infant at breast and maintain latch LATCH score: 9 Latch assessment: Deep Lips flanged: Yes Suck assessment: Displays both   Pre-feed weight: 3254 grams Post feed weight: 3262 grams Amount transferred: 8 ml Amount supplemented: 2 ounces formula via bottle  Totals: Total amount transferred: 18 ml Total supplement given: 60 ml Total amount pumped post feed: did not pump   Plan:  1. Offer infant the breast as  mom and infant want 2. Keep infant awake with feeding as needed 3. Massage/compress breast with feedings as needed to keep infant active at the breast 4. Empty one breast before offering the second breast 5. Offer infant a bottle of pumped breast milk or formula after breast feeding if she is still cueing to feed.  6. Infant needs about 60-80 ml (2-3 ounces) for 8 feedings a day or 480-640 ml (16-21 ounces) in 24 hours. Infant may take more or less per feeding depending on how often she feeds. Feed infant until she is satisfied.  7. Feed infant using the paced bottle feeding method (video on [email or web address].[email or web address]) 8. If infant not latching to the breast, would recommend that you pump 7-8 x a day with 1-2 being power pumping to protect and promote milk supply. If infant is latching to the breast, would recommend that you pump about 4 x a day after breast feeding to protect and promote milk supply.  9. Keep up the good work 10. Thank you for allowing me to assist you today 11. Please call with any questions/concerns as needed [phone] 12. Follow up with Lactation as needed or 1-5 days post tongue and lip releases  [place], [employer]  Debby Freiberg Omarri Eich 07/30/2019, 1:14 PM

## 2019-10-14 ENCOUNTER — Ambulatory Visit: Payer: Self-pay

## 2019-10-14 NOTE — Lactation Note (Signed)
This note was copied from a baby's chart. Lactation Consultation Note  Patient Name: Pamela Eaton Today's Date: 10/14/2019     10/14/2019  Name: Pamela Eaton MRN: [id number] Date of Birth: 07/20/2021 Gestational Age: Gestational Age: [redacted]w[redacted]d Birth Weight: 111.3 oz Weight today:  Weight: 11 lb 13.3 oz (6941 g)   69 month old infant presents today with mom for feeding assessment.   Infant has gained 2122 grams in the last 76 days with an average daily weight gain of 28 grams a day.   Infant is being treated for reflux with Prevacid that she stopped a few days a ago. She is having feeds thickened with rice cereal.   Infant latches infrequently. Mom has never had a full milk supply. Mom is pumping 1-2 x a day. She is wanting to increase her milk supply.   Infant with thin labial frenulum that inserts at the bottom of the gum ridge. Upper lip tight and blanches with flanging. Infant with sucking blister to top upper lip. Infant with intermittent clicking on the breast and the bottle. Mom reports infant likes to chomp on the breast. Infant with short posterior lingual frenulum with minimal mid tongue elevation noted. Infant with strong suckle on gloved finger with good tongue cupping noted. Infant with good tongue extension and lateralization. Parents decided not to do anything about her lips and tongue ties. Mom reports they are using the Avent Variable flow nipple and that is working well.   Infant will still latch some, not for long periods due to low milk supply.   Mom changed to pumping pals and reports she is using the small size and feels like it fits her best. Mom just started with pumping pals for the last 2 days. when pumping she is getting drops with pumping.   Mom has Diabetes and on Metformin. She would like to try the Fenugreek and is aware that she needs to watch her sugars. Information on Fenugreek and   Mom wants to try the SNS, she used the starter SNS. SNS  given at mom's request.   Infant to follow up Dr. Vicente Males about 4 months. Infant to follow up with Lactation as needed.    General Information: Mother's reason for visit: low milk supply Consult: Follow-up Lactation consultant: Noralee Stain RN,IBCLC Breastfeeding experience: latching a few times a day Maternal medical conditions: Polycystic ovarian syndrome, Diabetes, Infertility Maternal medications: Pre-natal vitamin(Metformin 1000 mg BID)  Breastfeeding History: Frequency of breast feeding: once or twice a day Duration of feeding: few minutes  Supplementation: Supplement method: bottle(Avent variable flow nipple)   Formula volume: 3-4 ounces Formula frequency: every 3-4 hours   Breast milk volume: few drops     Pump type: Medela pump in style Pump frequency: 2 x a day Pump volume: few gtts  Infant Output Assessment: Voids per 24 hours: 6+ Urine color: Clear yellow Stools per 24 hours: 1 Stool color: Yellow  Breast Assessment: Breast: Soft, Compressible, Other(asymmetrical)   Pain level: 0 Pain interventions: Bra, Breast pump  Feeding Assessment: Infant oral assessment: Variance Infant oral assessment comment: see note                                Additional Feeding Assessment:  Totals: Total amount transferred: infant fed prior to appt     Plan:  1. Offer infant the breast as mom and infant want 2. Keep infant awake with feeding as needed 3. Massage/compress breast with feedings as needed to keep infant active at the breast 4. Can try the SNS at the breast with formula in the bottle 5. Offer infant a bottle of pumped breast milk or formula after breast feeding if she is still cueing to feed.  6. Infant needs about 99-133 ml (3-4 ounces) for 8 feedings a day or [id number] ml (27 + ounces) in 24 hours. Infant may take more or less per feeding depending on how often she feeds. Feed infant until  she is satisfied.  7. Feed infant using the paced bottle feeding method (video on [email or web address]) 8. If infant not latching to the breast, would recommend that you pump 7-8 x a day with 1-2 being power pumping to protect and promote milk supply. If infant is latching to the breast, would recommend that you pump about 4 x a day after breast feeding to protect and promote milk supply.  9. Can try Fenugreek or Mother Love More Special Blend to see if milk supply increases 10. Reglan can be helpful for milk reproduction. The usual dosage is 10 mg 3 times a day for 10-30 days. It is prescribed by your OB. The side effects are depression or Tardive Diskinesia.  11. Keep up the good work 41. Thank you for allowing me to assist you today 13. Please call with any questions/concerns as needed [phone] 14. Follow up with Lactation as needed     Donn Pierini RN, IBCLC                                                     Debby Freiberg Cheryle Dark 10/14/2019, 10:30 AM

## 2020-02-11 ENCOUNTER — Ambulatory Visit: Payer: BC Managed Care – PPO | Attending: Internal Medicine

## 2020-02-11 DIAGNOSIS — Z23 Encounter for immunization: Secondary | ICD-10-CM

## 2020-02-11 NOTE — Progress Notes (Signed)
   Covid-19 Vaccination Clinic  Name:  Pamela Eaton    MRN: [id number] DOB: 12-19-1985  02/11/2020  [name] was observed post Covid-19 immunization for 15 minutes without incident. She was provided with Vaccine Information Sheet and instruction to access the V-Safe system.   [name] was instructed to call 911 with any severe reactions post vaccine: Marland Kitchen Difficulty breathing  . Swelling of face and throat  . A fast heartbeat  . A bad rash all over body  . Dizziness and weakness   Immunizations Administered    Name Date Dose VIS Date Route   Pfizer COVID-19 Vaccine 02/11/2020 12:53 PM 0.3 mL 11/06/2019 Intramuscular   Manufacturer: [employer], [employer]   Lot: [id number]   NDC: [address]-5

## 2020-03-08 ENCOUNTER — Ambulatory Visit: Payer: BC Managed Care – PPO | Attending: Internal Medicine

## 2020-03-08 DIAGNOSIS — Z23 Encounter for immunization: Secondary | ICD-10-CM

## 2020-03-08 NOTE — Progress Notes (Signed)
   Covid-19 Vaccination Clinic  Name:  Pamela Eaton    MRN: [id number] DOB: 1986-03-13  03/08/2020  [name] was observed post Covid-19 immunization for 15 minutes without incident. She was provided with Vaccine Information Sheet and instruction to access the V-Safe system.   [name] was instructed to call 911 with any severe reactions post vaccine: Marland Kitchen Difficulty breathing  . Swelling of face and throat  . A fast heartbeat  . A bad rash all over body  . Dizziness and weakness   Immunizations Administered    Name Date Dose VIS Date Route   Pfizer COVID-19 Vaccine 03/08/2020  8:16 AM 0.3 mL 11/06/2019 Intramuscular   Manufacturer: [employer], [employer]   Lot: [id number]   NDC: [address]-3
